# Patient Record
Sex: Male | Born: 1960 | Race: White | Hispanic: No | Marital: Married | Smoking: Former smoker
Health system: Southern US, Community
[De-identification: ages and names within clinical notes are randomized; demographics above are authoritative.]

## PROBLEM LIST (undated history)

## (undated) DIAGNOSIS — E119 Type 2 diabetes mellitus without complications: Secondary | ICD-10-CM

## (undated) DIAGNOSIS — I10 Essential (primary) hypertension: Secondary | ICD-10-CM

## (undated) DIAGNOSIS — I4891 Unspecified atrial fibrillation: Secondary | ICD-10-CM

## (undated) DIAGNOSIS — Z87891 Personal history of nicotine dependence: Secondary | ICD-10-CM

## (undated) DIAGNOSIS — E785 Hyperlipidemia, unspecified: Secondary | ICD-10-CM

## (undated) HISTORY — PX: BACK SURGERY: SHX140

---

## 2011-07-05 ENCOUNTER — Emergency Department (HOSPITAL_COMMUNITY)
Admission: EM | Admit: 2011-07-05 | Discharge: 2011-07-06 | Disposition: A | Discharge status: Home / Self Care | Payer: Worker's Compensation | Attending: Emergency Medicine | Admitting: Emergency Medicine

## 2011-07-05 ENCOUNTER — Emergency Department (HOSPITAL_COMMUNITY): Payer: Worker's Compensation

## 2011-07-05 ENCOUNTER — Encounter: Payer: Self-pay | Admitting: Emergency Medicine

## 2011-07-05 DIAGNOSIS — S161XXA Strain of muscle, fascia and tendon at neck level, initial encounter: Secondary | ICD-10-CM

## 2011-07-05 DIAGNOSIS — S20219A Contusion of unspecified front wall of thorax, initial encounter: Secondary | ICD-10-CM | POA: Insufficient documentation

## 2011-07-05 DIAGNOSIS — M25529 Pain in unspecified elbow: Secondary | ICD-10-CM | POA: Insufficient documentation

## 2011-07-05 DIAGNOSIS — Z87891 Personal history of nicotine dependence: Secondary | ICD-10-CM | POA: Insufficient documentation

## 2011-07-05 DIAGNOSIS — S139XXA Sprain of joints and ligaments of unspecified parts of neck, initial encounter: Secondary | ICD-10-CM | POA: Insufficient documentation

## 2011-07-05 DIAGNOSIS — S5002XA Contusion of left elbow, initial encounter: Secondary | ICD-10-CM

## 2011-07-05 DIAGNOSIS — M542 Cervicalgia: Secondary | ICD-10-CM | POA: Insufficient documentation

## 2011-07-05 DIAGNOSIS — S5000XA Contusion of unspecified elbow, initial encounter: Secondary | ICD-10-CM | POA: Insufficient documentation

## 2011-07-05 DIAGNOSIS — R079 Chest pain, unspecified: Secondary | ICD-10-CM | POA: Insufficient documentation

## 2011-07-05 MED ORDER — OXYCODONE-ACETAMINOPHEN 5-325 MG PO TABS
1.0000 | ORAL_TABLET | Freq: Once | ORAL | Status: AC
  Administered 2011-07-05: 1 via ORAL
  Filled 2011-07-05: qty 1

## 2011-07-05 MED ORDER — IOHEXOL 300 MG/ML  SOLN
100.0000 mL | Freq: Once | INTRAMUSCULAR | Status: AC | PRN
  Administered 2011-07-05: 100 mL via INTRAVENOUS

## 2011-07-05 MED ORDER — OXYCODONE-ACETAMINOPHEN 5-325 MG PO TABS: 1.0000 | ORAL_TABLET | Freq: Four times a day (QID) | ORAL | Status: AC | PRN
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
## 2011-07-06 NOTE — ED Notes (Signed)
Pt given discharge instructions, paperwork & prescription(s), pt verbalized understanding.   

## 2012-04-27 ENCOUNTER — Inpatient Hospital Stay (HOSPITAL_COMMUNITY): Payer: BC Managed Care – PPO

## 2012-04-27 ENCOUNTER — Encounter (HOSPITAL_COMMUNITY): Payer: Self-pay

## 2012-04-27 ENCOUNTER — Emergency Department (HOSPITAL_COMMUNITY): Payer: BC Managed Care – PPO

## 2012-04-27 ENCOUNTER — Inpatient Hospital Stay (HOSPITAL_COMMUNITY)
Admission: EM | Admit: 2012-04-27 | Discharge: 2012-05-02 | DRG: 219 | Disposition: A | Discharge status: Home Health | Payer: BC Managed Care – PPO | Attending: Orthopedic Surgery | Admitting: Orthopedic Surgery

## 2012-04-27 DIAGNOSIS — I4891 Unspecified atrial fibrillation: Secondary | ICD-10-CM | POA: Diagnosis present

## 2012-04-27 DIAGNOSIS — S82831A Other fracture of upper and lower end of right fibula, initial encounter for closed fracture: Secondary | ICD-10-CM

## 2012-04-27 DIAGNOSIS — Z87891 Personal history of nicotine dependence: Secondary | ICD-10-CM

## 2012-04-27 DIAGNOSIS — Y92410 Unspecified street and highway as the place of occurrence of the external cause: Secondary | ICD-10-CM

## 2012-04-27 DIAGNOSIS — Y998 Other external cause status: Secondary | ICD-10-CM

## 2012-04-27 DIAGNOSIS — E785 Hyperlipidemia, unspecified: Secondary | ICD-10-CM | POA: Diagnosis present

## 2012-04-27 DIAGNOSIS — S82843A Displaced bimalleolar fracture of unspecified lower leg, initial encounter for closed fracture: Secondary | ICD-10-CM | POA: Diagnosis present

## 2012-04-27 DIAGNOSIS — S82301A Unspecified fracture of lower end of right tibia, initial encounter for closed fracture: Secondary | ICD-10-CM

## 2012-04-27 DIAGNOSIS — S82209A Unspecified fracture of shaft of unspecified tibia, initial encounter for closed fracture: Principal | ICD-10-CM | POA: Diagnosis present

## 2012-04-27 DIAGNOSIS — I10 Essential (primary) hypertension: Secondary | ICD-10-CM | POA: Diagnosis present

## 2012-04-27 DIAGNOSIS — Z8249 Family history of ischemic heart disease and other diseases of the circulatory system: Secondary | ICD-10-CM

## 2012-04-27 HISTORY — DX: Essential (primary) hypertension: I10

## 2012-04-27 HISTORY — DX: Personal history of nicotine dependence: Z87.891

## 2012-04-27 HISTORY — DX: Hyperlipidemia, unspecified: E78.5

## 2012-04-27 HISTORY — DX: Unspecified atrial fibrillation: I48.91

## 2012-04-27 LAB — BASIC METABOLIC PANEL
BUN: 17 mg/dL (ref 6–23)
BUN: 12 mg/dL (ref 6–23)
CO2: 29 mEq/L (ref 19–32)
Calcium: 8.9 mg/dL (ref 8.4–10.5)
Chloride: 99 mEq/L (ref 96–112)
Creatinine, Ser: 0.96 mg/dL (ref 0.50–1.35)
GFR calc Af Amer: >90 mL/min (ref >90)
Glucose, Bld: 112 mg/dL — ABNORMAL HIGH (ref 70–99)
Sodium: 136 mEq/L (ref 135–145)

## 2012-04-27 LAB — CBC WITH DIFFERENTIAL/PLATELET
Basophils Relative: 1 % (ref 0–1)
HCT: 37.6 % — ABNORMAL LOW (ref 39.0–52.0)
Hemoglobin: 13.1 g/dL (ref 13.0–17.0)
MCH: 32.9 pg (ref 26.0–34.0)
MCHC: 34.8 g/dL (ref 30.0–36.0)
Monocytes Absolute: 0.9 10*3/uL (ref 0.1–1.0)
Monocytes Relative: 8 % (ref 3–12)
Neutro Abs: 6.9 10*3/uL (ref 1.7–7.7)

## 2012-04-27 LAB — POCT I-STAT TROPONIN I: Troponin i, poc: 0.01 ng/mL (ref 0.00–0.08)

## 2012-04-27 LAB — CBC
HCT: 36.6 % — ABNORMAL LOW (ref 39.0–52.0)
Hemoglobin: 13 g/dL (ref 13.0–17.0)
MCH: 33.6 pg (ref 26.0–34.0)
RBC: 3.87 MIL/uL — ABNORMAL LOW (ref 4.22–5.81)

## 2012-04-27 MED ORDER — SODIUM CHLORIDE 0.9 % IV SOLN
INTRAVENOUS | Status: DC
  Administered 2012-04-27 – 2012-04-30 (×5): via INTRAVENOUS

## 2012-04-27 MED ORDER — HYDROMORPHONE HCL PF 1 MG/ML IJ SOLN
1.0000 mg | INTRAMUSCULAR | Status: DC | PRN
  Administered 2012-04-27: 1 mg via INTRAVENOUS
  Filled 2012-04-27: qty 1

## 2012-04-27 MED ORDER — ONDANSETRON HCL 4 MG/2ML IJ SOLN
4.0000 mg | Freq: Three times a day (TID) | INTRAMUSCULAR | Status: AC | PRN
  Administered 2012-04-27: 4 mg via INTRAVENOUS
  Filled 2012-04-27: qty 2

## 2012-04-27 MED ORDER — HYDROMORPHONE HCL PF 1 MG/ML IJ SOLN
0.5000 mg | INTRAMUSCULAR | Status: DC | PRN
  Administered 2012-04-27 – 2012-05-01 (×18): 1 mg via INTRAVENOUS
  Filled 2012-04-27 (×19): qty 1

## 2012-04-27 MED ORDER — HYDROCODONE-ACETAMINOPHEN 5-325 MG PO TABS
1.0000 | ORAL_TABLET | ORAL | Status: DC | PRN
  Administered 2012-04-27 – 2012-05-02 (×20): 2 via ORAL
  Filled 2012-04-27 (×20): qty 2

## 2012-04-27 MED ORDER — SODIUM CHLORIDE 0.9 % IV SOLN
Freq: Once | INTRAVENOUS | Status: AC
  Administered 2012-04-27: 03:00:00 via INTRAVENOUS

## 2012-04-27 MED ORDER — METHOCARBAMOL 500 MG PO TABS
500.0000 mg | ORAL_TABLET | Freq: Three times a day (TID) | ORAL | Status: DC | PRN
  Administered 2012-04-27 – 2012-05-01 (×11): 500 mg via ORAL
  Filled 2012-04-27 (×12): qty 1

## 2012-04-27 NOTE — ED Notes (Signed)
Family at bedside. 
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
## 2012-04-27 NOTE — Progress Notes (Signed)
Subjective: Patient states pain is controlled with pain meds. Otherwise no complaints. Objective: Vital signs in last 24 hours: Temp:  [97 F (36.1 C)-98.5 F (36.9 C)] 98.5 F (36.9 C) (09/28 0600) Pulse Rate:  [72-111] 72  (09/28 0600) Resp:  [10-20] 20  (09/28 0600) BP: (111-142)/(66-92) 113/66 mmHg (09/28 0600) SpO2:  [100 %] 100 % (09/28 0600)  Intake/Output from previous day: 09/27 0701 - 09/28 0700 In: 375 [I.V.:375] Out: -  Intake/Output this shift:     Basename 04/27/12 0938 04/27/12 0053  HGB 13.0 13.1    Basename 04/27/12 0938 04/27/12 0053  WBC 9.6 10.5  RBC 3.87* 3.98*  HCT 36.6* 37.6*  PLT 248 283    Basename 04/27/12 0938 04/27/12 0053  NA 136 138  K 4.0 3.9  CL 101 99  CO2 29 27  BUN 12 17  CREATININE 0.94 0.96  GLUCOSE 112* 94  CALCIUM 8.9 9.6    Basename 04/27/12 0053  LABPT --  INR 0.99    Splint clean dry and intact Able to wiggle toes Right foot without pain Capillary refill right toes < 2 seconds Sensation intact right lesser toes to light touch Distal femur with soft compartments   Assessment/Plan: Awaiting surgery continue current care    Richardean Canal 04/27/2012, 12:33 PM
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
## 2012-04-27 NOTE — ED Notes (Signed)
MD at bedside. EDP [place] at bedside
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
## 2012-04-28 MED ORDER — ENOXAPARIN SODIUM 40 MG/0.4ML ~~LOC~~ SOLN
40.0000 mg | Freq: Once | SUBCUTANEOUS | Status: DC
  Filled 2012-04-28: qty 0.4

## 2012-04-28 NOTE — Progress Notes (Signed)
Subjective: Patient doing well. No complaints just wants to eat.  Objective: Vital signs in last 24 hours: Temp:  [98 F (36.7 C)-98.1 F (36.7 C)] 98.1 F (36.7 C) (09/29 0521) Pulse Rate:  [83-93] 93  (09/29 0521) Resp:  [18-20] 18  (09/29 0521) BP: (119-141)/(76-89) 128/83 mmHg (09/29 0521) SpO2:  [98 %-100 %] 99 % (09/29 0521) Weight:  [104.327 kg (230 lb)] 104.327 kg (230 lb) (09/28 2127)  Intake/Output from previous day: 09/28 0701 - 09/29 0700 In: 680 [P.O.:680] Out: 2100 [Urine:2100] Intake/Output this shift:     Basename 04/27/12 0938 04/27/12 0053  HGB 13.0 13.1    Basename 04/27/12 0938 04/27/12 0053  WBC 9.6 10.5  RBC 3.87* 3.98*  HCT 36.6* 37.6*  PLT 248 283    Basename 04/27/12 0938 04/27/12 0053  NA 136 138  K 4.0 3.9  CL 101 99  CO2 29 27  BUN 12 17  CREATININE 0.94 0.96  GLUCOSE 112* 94  CALCIUM 8.9 9.6    Basename 04/27/12 0053  LABPT --  INR 0.99    Neurovascular intact splint C/D/I fits well Toe edema improved Assessment/Plan: Closed right comminuted tibia fracture extending to the joint with posterior-medial joint   NPO after mid night tonight Surgery pending Continue current care      Richardean Canal 04/28/2012, 12:13 PM

## 2012-04-29 ENCOUNTER — Inpatient Hospital Stay (HOSPITAL_COMMUNITY): Payer: BC Managed Care – PPO

## 2012-04-29 ENCOUNTER — Encounter (HOSPITAL_COMMUNITY): Payer: Self-pay | Admitting: Certified Registered"

## 2012-04-29 ENCOUNTER — Inpatient Hospital Stay (HOSPITAL_COMMUNITY): Payer: BC Managed Care – PPO | Admitting: Certified Registered"

## 2012-04-29 ENCOUNTER — Encounter (HOSPITAL_COMMUNITY)
Admission: EM | Disposition: A | Discharge status: Home Health | Payer: Self-pay | Source: Home / Self Care | Attending: Orthopedic Surgery

## 2012-04-29 HISTORY — PX: TIBIA IM NAIL INSERTION: SHX2516

## 2012-04-29 HISTORY — PX: ORIF ANKLE FRACTURE: SHX5408

## 2012-04-29 LAB — SURGICAL PCR SCREEN: Staphylococcus aureus: POSITIVE — AB

## 2012-04-29 SURGERY — INSERTION, INTRAMEDULLARY ROD, TIBIA
Anesthesia: General | Site: Leg Lower | Laterality: Right | Wound class: Clean

## 2012-04-29 MED ORDER — PROPOFOL 10 MG/ML IV BOLUS
INTRAVENOUS | Status: DC | PRN
  Administered 2012-04-29: 200 mg via INTRAVENOUS

## 2012-04-29 MED ORDER — HYDROMORPHONE HCL PF 1 MG/ML IJ SOLN
0.2500 mg | INTRAMUSCULAR | Status: DC | PRN
  Administered 2012-04-29 (×2): 0.5 mg via INTRAVENOUS

## 2012-04-29 MED ORDER — MIDAZOLAM HCL 5 MG/5ML IJ SOLN
INTRAMUSCULAR | Status: DC | PRN
  Administered 2012-04-29: 2 mg via INTRAVENOUS

## 2012-04-29 MED ORDER — HYDROMORPHONE HCL PF 1 MG/ML IJ SOLN
INTRAMUSCULAR | Status: DC | PRN
  Administered 2012-04-29 (×4): .25 mg via INTRAVENOUS

## 2012-04-29 MED ORDER — NEOSTIGMINE METHYLSULFATE 1 MG/ML IJ SOLN
INTRAMUSCULAR | Status: DC | PRN
  Administered 2012-04-29: 3 mg via INTRAVENOUS

## 2012-04-29 MED ORDER — ROCURONIUM BROMIDE 100 MG/10ML IV SOLN
INTRAVENOUS | Status: DC | PRN
  Administered 2012-04-29: 10 mg via INTRAVENOUS
  Administered 2012-04-29: 50 mg via INTRAVENOUS
  Administered 2012-04-29: 20 mg via INTRAVENOUS
  Administered 2012-04-29 (×2): 10 mg via INTRAVENOUS

## 2012-04-29 MED ORDER — CEFAZOLIN SODIUM-DEXTROSE 2-3 GM-% IV SOLR
INTRAVENOUS | Status: AC
  Filled 2012-04-29: qty 50

## 2012-04-29 MED ORDER — ACETAMINOPHEN 10 MG/ML IV SOLN
INTRAVENOUS | Status: AC
  Filled 2012-04-29: qty 100

## 2012-04-29 MED ORDER — GLYCOPYRROLATE 0.2 MG/ML IJ SOLN
INTRAMUSCULAR | Status: DC | PRN
  Administered 2012-04-29: 0.4 mg via INTRAVENOUS

## 2012-04-29 MED ORDER — ACETAMINOPHEN 10 MG/ML IV SOLN
INTRAVENOUS | Status: DC | PRN
  Administered 2012-04-29: 1000 mg via INTRAVENOUS

## 2012-04-29 MED ORDER — OXYCODONE HCL 5 MG/5ML PO SOLN: 5.0000 mg | Freq: Once | ORAL | Status: AC | PRN

## 2012-04-29 MED ORDER — CEFAZOLIN SODIUM 1-5 GM-% IV SOLN
INTRAVENOUS | Status: DC | PRN
  Administered 2012-04-29: 2 g via INTRAVENOUS

## 2012-04-29 MED ORDER — OXYCODONE HCL 5 MG PO TABS
ORAL_TABLET | ORAL | Status: AC
  Filled 2012-04-29: qty 1

## 2012-04-29 MED ORDER — LIDOCAINE HCL (CARDIAC) 20 MG/ML IV SOLN
INTRAVENOUS | Status: DC | PRN
  Administered 2012-04-29: 100 mg via INTRAVENOUS

## 2012-04-29 MED ORDER — OXYCODONE HCL 5 MG PO TABS
5.0000 mg | ORAL_TABLET | Freq: Once | ORAL | Status: AC | PRN
  Administered 2012-04-29: 5 mg via ORAL

## 2012-04-29 MED ORDER — LACTATED RINGERS IV SOLN
INTRAVENOUS | Status: DC | PRN
  Administered 2012-04-29 (×4): via INTRAVENOUS

## 2012-04-29 MED ORDER — PROPRANOLOL HCL 1 MG/ML IV SOLN
INTRAVENOUS | Status: DC | PRN
  Administered 2012-04-29 (×3): .25 mg via INTRAVENOUS

## 2012-04-29 MED ORDER — PROMETHAZINE HCL 25 MG/ML IJ SOLN
6.2500 mg | INTRAMUSCULAR | Status: DC | PRN
  Filled 2012-04-29: qty 1

## 2012-04-29 MED ORDER — ONDANSETRON HCL 4 MG/2ML IJ SOLN
INTRAMUSCULAR | Status: DC | PRN
  Administered 2012-04-29: 4 mg via INTRAVENOUS

## 2012-04-29 MED ORDER — FENTANYL CITRATE 0.05 MG/ML IJ SOLN
INTRAMUSCULAR | Status: DC | PRN
  Administered 2012-04-29 (×4): 50 ug via INTRAVENOUS
  Administered 2012-04-29: 100 ug via INTRAVENOUS
  Administered 2012-04-29 (×4): 50 ug via INTRAVENOUS

## 2012-04-29 MED ORDER — 0.9 % SODIUM CHLORIDE (POUR BTL) OPTIME
TOPICAL | Status: DC | PRN
  Administered 2012-04-29: 1000 mL

## 2012-04-29 MED ORDER — VECURONIUM BROMIDE 10 MG IV SOLR
INTRAVENOUS | Status: DC | PRN
  Administered 2012-04-29 (×2): 2 mg via INTRAVENOUS

## 2012-04-29 MED ORDER — HYDROMORPHONE HCL PF 1 MG/ML IJ SOLN
INTRAMUSCULAR | Status: AC
  Filled 2012-04-29: qty 1

## 2012-04-29 MED ORDER — MEPERIDINE HCL 25 MG/ML IJ SOLN: 6.2500 mg | INTRAMUSCULAR | Status: DC | PRN

## 2012-04-29 SURGICAL SUPPLY — 87 items
BANDAGE ELASTIC 4 VELCRO ST LF (GAUZE/BANDAGES/DRESSINGS) ×2 IMPLANT
BANDAGE ELASTIC 6 VELCRO ST LF (GAUZE/BANDAGES/DRESSINGS) ×2 IMPLANT
BANDAGE ESMARK 6X9 LF (GAUZE/BANDAGES/DRESSINGS) ×1 IMPLANT
BANDAGE GAUZE ELAST BULKY 4 IN (GAUZE/BANDAGES/DRESSINGS) ×4 IMPLANT
BIT DRILL 2.5X2.75 QC CALB (BIT) ×2 IMPLANT
BIT DRILL 3.5X5.5 QC CALB (BIT) ×2 IMPLANT
BIT DRILL 4.4 (MISCELLANEOUS) ×2 IMPLANT
BIT DRILL 6X3.8 (MISCELLANEOUS) ×2 IMPLANT
BLADE SURG 10 STRL SS (BLADE) ×4 IMPLANT
BNDG COHESIVE 4X5 TAN STRL (GAUZE/BANDAGES/DRESSINGS) ×2 IMPLANT
BNDG ESMARK 6X9 LF (GAUZE/BANDAGES/DRESSINGS) ×2
BRUSH SCRUB DISP (MISCELLANEOUS) ×4 IMPLANT
CLOTH BEACON ORANGE TIMEOUT ST (SAFETY) ×2 IMPLANT
COVER SURGICAL LIGHT HANDLE (MISCELLANEOUS) ×4 IMPLANT
DRAPE C-ARM 42X72 X-RAY (DRAPES) ×2 IMPLANT
DRAPE C-ARMOR (DRAPES) ×2 IMPLANT
DRAPE INCISE IOBAN 66X45 STRL (DRAPES) ×2 IMPLANT
DRAPE ORTHO SPLIT 77X108 STRL (DRAPES) ×2
DRAPE PROXIMA HALF (DRAPES) ×4 IMPLANT
DRAPE SURG ORHT 6 SPLT 77X108 (DRAPES) ×2 IMPLANT
DRAPE U-SHAPE 47X51 STRL (DRAPES) ×2 IMPLANT
DRSG ADAPTIC 3X8 NADH LF (GAUZE/BANDAGES/DRESSINGS) ×2 IMPLANT
DRSG EMULSION OIL 3X3 NADH (GAUZE/BANDAGES/DRESSINGS) IMPLANT
DRSG PAD ABDOMINAL 8X10 ST (GAUZE/BANDAGES/DRESSINGS) ×2 IMPLANT
ELECT REM PT RETURN 9FT ADLT (ELECTROSURGICAL) ×2
ELECTRODE REM PT RTRN 9FT ADLT (ELECTROSURGICAL) ×1 IMPLANT
EVACUATOR 1/8 PVC DRAIN (DRAIN) IMPLANT
GLOVE BIO SURGEON ST LM GN SZ9 (GLOVE) ×2 IMPLANT
GLOVE BIO SURGEON STRL SZ7.5 (GLOVE) IMPLANT
GLOVE BIO SURGEON STRL SZ8 (GLOVE) ×2 IMPLANT
GLOVE BIO SURGEON STRL SZ8.5 (GLOVE) ×2 IMPLANT
GLOVE BIOGEL PI IND STRL 6.5 (GLOVE) ×1 IMPLANT
GLOVE BIOGEL PI IND STRL 7.5 (GLOVE) IMPLANT
GLOVE BIOGEL PI IND STRL 8 (GLOVE) ×1 IMPLANT
GLOVE BIOGEL PI INDICATOR 6.5 (GLOVE) ×1
GLOVE BIOGEL PI INDICATOR 7.5 (GLOVE)
GLOVE BIOGEL PI INDICATOR 8 (GLOVE) ×1
GLOVE SURG SS PI 6.0 STRL IVOR (GLOVE) ×2 IMPLANT
GOWN BRE IMP SLV SIRUS LXLNG (GOWN DISPOSABLE) ×2 IMPLANT
GOWN PREVENTION PLUS XLARGE (GOWN DISPOSABLE) ×2 IMPLANT
GOWN STRL NON-REIN LRG LVL3 (GOWN DISPOSABLE) ×4 IMPLANT
GOWN STRL REIN 2XL XLG LVL4 (GOWN DISPOSABLE) ×2 IMPLANT
GUIDEWIRE BALL NOSE 80CM (WIRE) ×2 IMPLANT
KIT BASIN OR (CUSTOM PROCEDURE TRAY) ×2 IMPLANT
KIT ROOM TURNOVER OR (KITS) ×2 IMPLANT
MANIFOLD NEPTUNE II (INSTRUMENTS) ×2 IMPLANT
NAIL TIBIAL 10X39M (Nail) ×2 IMPLANT
NEEDLE HYPO 21X1.5 SAFETY (NEEDLE) IMPLANT
NS IRRIG 1000ML POUR BTL (IV SOLUTION) ×2 IMPLANT
PACK GENERAL/GYN (CUSTOM PROCEDURE TRAY) ×2 IMPLANT
PAD ARMBOARD 7.5X6 YLW CONV (MISCELLANEOUS) ×4 IMPLANT
PAD CAST 4YDX4 CTTN HI CHSV (CAST SUPPLIES) IMPLANT
PADDING CAST COTTON 4X4 STRL (CAST SUPPLIES)
PADDING CAST COTTON 6X4 STRL (CAST SUPPLIES) IMPLANT
PENCIL BUTTON HOLSTER BLD 10FT (ELECTRODE) ×2 IMPLANT
SCREW ACECAP 44MM (Screw) ×2 IMPLANT
SCREW ACECAP 50MM (Screw) ×2 IMPLANT
SCREW CORTICAL 3.5MM  42MM (Screw) ×1 IMPLANT
SCREW CORTICAL 3.5MM 42MM (Screw) ×1 IMPLANT
SCREW CORTICAL 3.5MM 50MM (Screw) ×2 IMPLANT
SCREW PROXIMAL 4.5MMX18MM (Screw) ×2 IMPLANT
SCREW PROXIMAL DEPUY (Screw) ×2 IMPLANT
SCREW PRXML FT 60X5.5XNS LF (Screw) ×1 IMPLANT
SCREW PRXML FT 65X5.5XNS CORT (Screw) ×1 IMPLANT
SPONGE GAUZE 4X4 12PLY (GAUZE/BANDAGES/DRESSINGS) ×2 IMPLANT
SPONGE LAP 18X18 X RAY DECT (DISPOSABLE) IMPLANT
SPONGE SCRUB IODOPHOR (GAUZE/BANDAGES/DRESSINGS) ×2 IMPLANT
STAPLER VISISTAT 35W (STAPLE) ×2 IMPLANT
STRIP CLOSURE SKIN 1/2X4 (GAUZE/BANDAGES/DRESSINGS) ×2 IMPLANT
SUCTION FRAZIER TIP 10 FR DISP (SUCTIONS) ×2 IMPLANT
SUT ETHILON 2 0 FS 18 (SUTURE) IMPLANT
SUT ETHILON 3 0 PS 1 (SUTURE) ×4 IMPLANT
SUT PDS AB 2-0 CT1 27 (SUTURE) IMPLANT
SUT PROLENE 3 0 PS 2 (SUTURE) IMPLANT
SUT VIC AB 0 CT1 27 (SUTURE) ×1
SUT VIC AB 0 CT1 27XBRD ANBCTR (SUTURE) ×1 IMPLANT
SUT VIC AB 2-0 CT1 27 (SUTURE) ×1
SUT VIC AB 2-0 CT1 TAPERPNT 27 (SUTURE) ×1 IMPLANT
SUT VIC AB 2-0 CT3 27 (SUTURE) IMPLANT
SYR CONTROL 10ML LL (SYRINGE) IMPLANT
TOWEL OR 17X24 6PK STRL BLUE (TOWEL DISPOSABLE) ×2 IMPLANT
TOWEL OR 17X26 10 PK STRL BLUE (TOWEL DISPOSABLE) ×4 IMPLANT
TRAY FOLEY CATH 14FR (SET/KITS/TRAYS/PACK) ×2 IMPLANT
TUBE CONNECTING 12X1/4 (SUCTIONS) ×2 IMPLANT
UNDERPAD 30X30 INCONTINENT (UNDERPADS AND DIAPERS) ×2 IMPLANT
WATER STERILE IRR 1000ML POUR (IV SOLUTION) IMPLANT
YANKAUER SUCT BULB TIP NO VENT (SUCTIONS) ×2 IMPLANT
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
## 2012-04-29 NOTE — Anesthesia Postprocedure Evaluation (Signed)
Anesthesia Post Note  Patient: [name] Love  Procedure(s) Performed: Procedure(s) (LRB): INTRAMEDULLARY (IM) NAIL TIBIAL (Right) OPEN REDUCTION INTERNAL FIXATION (ORIF) ANKLE FRACTURE (Right)  Anesthesia type: general  Patient location: PACU  Post pain: Pain level controlled  Post assessment: Patient's Cardiovascular Status Stable  Last Vitals:  Filed Vitals:   04/29/12 1547  BP: 133/84  Pulse: 80  Temp: 37.1 C  Resp: 14    Post vital signs: Reviewed and stable  Level of consciousness: sedated  Complications: No apparent anesthesia complications

## 2012-04-29 NOTE — Preoperative (Signed)
Beta Blockers   Reason not to administer Beta Blockers:Not Applicable 

## 2012-04-29 NOTE — Brief Op Note (Signed)
04/27/2012 - 04/29/2012  4:08 PM  PATIENT:  [name] Love  51 y.o. male  PRE-OPERATIVE DIAGNOSIS:  Tibial shaft fracture, bimalleolar plafond fracture  POST-OPERATIVE DIAGNOSIS:  Tibial shaft fracture,  bimalleolar plafond fracture  PROCEDURE:  IMN right tibia Biomet Versanail 10x390 static  ORIF bimalleolar plafond frx  SURGEON:  Surgeon(s) and Role:    * Budd Palmer, MD - Primary  ANESTHESIA:   general  EBL:  Total I/O In: 2200 [I.V.:2200] Out: 1120 [Urine:1100; Blood:20]  BLOOD ADMINISTERED:none  DRAINS: none   LOCAL MEDICATIONS USED:  NONE  SPECIMEN:  No Specimen  DISPOSITION OF SPECIMEN:  N/A  COUNTS:  YES  TOURNIQUET:  * No tourniquets in log *  DICTATION: .Other Dictation: Dictation Number [id number]  PLAN OF CARE: Admit to inpatient   PATIENT DISPOSITION:  PACU - hemodynamically stable.   Delay start of Pharmacological VTE agent (>24hrs) due to surgical blood loss or risk of bleeding: no

## 2012-04-29 NOTE — Anesthesia Preprocedure Evaluation (Signed)
Anesthesia Evaluation  Patient identified by MRN, date of birth, ID band Patient awake    Reviewed: Allergy & Precautions, H&P , NPO status , Patient's Chart, lab work & pertinent test results  History of Anesthesia Complications Negative for: history of anesthetic complications  Airway Mallampati: II  Neck ROM: Full    Dental  (+) Teeth Intact   Pulmonary neg pulmonary ROS,  breath sounds clear to auscultation        Cardiovascular hypertension, + dysrhythmias Atrial Fibrillation Rhythm:Irregular Rate:Normal  Rx c ASA only   Neuro/Psych    GI/Hepatic negative GI ROS, Neg liver ROS,   Endo/Other  negative endocrine ROS  Renal/GU negative Renal ROS     Musculoskeletal   Abdominal (+) + obese,   Peds  Hematology negative hematology ROS (+)   Anesthesia Other Findings   Reproductive/Obstetrics                           Anesthesia Physical Anesthesia Plan  ASA: II  Anesthesia Plan: General   Post-op Pain Management:    Induction: Intravenous  Airway Management Planned: Oral ETT  Additional Equipment:   Intra-op Plan:   Post-operative Plan: Extubation in OR  Informed Consent: I have reviewed the patients History and Physical, chart, labs and discussed the procedure including the risks, benefits and alternatives for the proposed anesthesia with the patient or authorized representative who has indicated his/her understanding and acceptance.   Dental advisory given  Plan Discussed with:   Anesthesia Plan Comments:         Anesthesia Quick Evaluation

## 2012-04-29 NOTE — Transfer of Care (Signed)
Immediate Anesthesia Transfer of Care Note  Patient: [name] Love  Procedure(s) Performed: Procedure(s) (LRB) with comments: INTRAMEDULLARY (IM) NAIL TIBIAL (Right) - Dupuy nail, Biomet versanail, Synthes small fragment, Depuy small fragment OPEN REDUCTION INTERNAL FIXATION (ORIF) ANKLE FRACTURE (Right)  Patient Location: PACU  Anesthesia Type: General  Level of Consciousness: awake, alert , oriented and patient cooperative  Airway & Oxygen Therapy: Patient Spontanous Breathing and Patient connected to nasal cannula oxygen  Post-op Assessment: Report given to PACU RN and Post -op Vital signs reviewed and stable  Post vital signs: Reviewed and stable  Complications: No apparent anesthesia complications

## 2012-04-30 ENCOUNTER — Encounter (HOSPITAL_COMMUNITY): Payer: Self-pay | Admitting: Orthopedic Surgery

## 2012-04-30 ENCOUNTER — Inpatient Hospital Stay (HOSPITAL_COMMUNITY): Payer: BC Managed Care – PPO

## 2012-04-30 MED ORDER — ENOXAPARIN SODIUM 40 MG/0.4ML ~~LOC~~ SOLN: 40.0000 mg | Freq: Once | SUBCUTANEOUS | Status: DC

## 2012-04-30 MED ORDER — HYDROCODONE-ACETAMINOPHEN 5-325 MG PO TABS: 1.0000 | ORAL_TABLET | ORAL | Status: DC | PRN
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
## 2012-04-30 NOTE — Progress Notes (Signed)
Physical Therapy Evaluation Patient Details Name: [name] Love MRN: [id number] DOB: July 24, 1961 Today's Date: 04/30/2012 Time: 9528-4132 PT Time Calculation (min): 25 min  PT Assessment / Plan / Recommendation Clinical Impression  Pt is a 51 y.o. M s/p ORIF R LE.  Pt will benefit from acute physical therapy to increase safety, independence, strength, and endurance.    PT Assessment  Patient needs continued PT services    Follow Up Recommendations  Home health PT    Barriers to Discharge None      Equipment Recommendations  Rolling walker with 5" wheels;3 in 1 bedside comode    Recommendations for Other Services OT consult   Frequency Min 5X/week    Precautions / Restrictions Precautions Precautions: Fall Restrictions Weight Bearing Restrictions: Yes RLE Weight Bearing: Non weight bearing   Pertinent Vitals/Pain Pain is 5/10 in R LE.      Mobility  Bed Mobility Bed Mobility: Supine to Sit;Sitting - Scoot to Edge of Bed Supine to Sit: 4: Min assist;HOB flat Sitting - Scoot to [place] of Bed: 4: Min assist Details for Bed Mobility Assistance: (A) to support R LE and lower the LE OOB.  Cues for proper technique and safety Transfers Transfers: Sit to Stand;Stand to Sit;Stand Pivot Transfers Sit to Stand: 4: Min assist;With upper extremity assist;From bed Stand to Sit: 4: Min assist;To chair/3-in-1;With upper extremity assist Stand Pivot Transfers: 4: Min assist Details for Transfer Assistance: (A) for RW placement, initiating mvt, and slowing the descent.  Cues for proper technique, hand placement, and safety Ambulation/Gait Ambulation/Gait Assistance: Not tested (comment)    Shoulder Instructions     Exercises General Exercises - Lower Extremity Ankle Circles/Pumps: AROM;Left;5 reps   PT Diagnosis: Difficulty walking;Abnormality of gait;Generalized weakness;Acute pain  PT Problem List: Decreased strength;Decreased range of motion;Decreased activity  tolerance;Decreased balance;Decreased mobility;Pain;Decreased knowledge of use of DME PT Treatment Interventions: DME instruction;Gait training;Stair training;Functional mobility training;Therapeutic activities;Therapeutic exercise;Balance training;Neuromuscular re-education;Patient/family education   PT Goals Acute Rehab PT Goals PT Goal Formulation: With patient Time For Goal Achievement: 05/07/12 Potential to Achieve Goals: Good Pt will go Supine/Side to Sit: with modified independence;with HOB 0 degrees PT Goal: Supine/Side to Sit - Progress: Goal set today Pt will go Sit to Supine/Side: with modified independence;with HOB 0 degrees PT Goal: Sit to Supine/Side - Progress: Goal set today Pt will go Sit to Stand: with modified independence;with upper extremity assist PT Goal: Sit to Stand - Progress: Goal set today Pt will go Stand to Sit: with modified independence;with upper extremity assist PT Goal: Stand to Sit - Progress: Goal set today Pt will Transfer Bed to Chair/Chair to Bed: with modified independence PT Transfer Goal: Bed to Chair/Chair to Bed - Progress: Goal set today Pt will Ambulate: 51 - 150 feet;with supervision;with least restrictive assistive device PT Goal: Ambulate - Progress: Goal set today Pt will Go Up / Down Stairs: 3-5 stairs;with least restrictive assistive device PT Goal: Up/Down Stairs - Progress: Goal set today Pt will Perform Home Exercise Program: Independently PT Goal: Perform Home Exercise Program - Progress: Goal set today  Visit Information  Last PT Received On: 04/30/12 Assistance Needed: +1    Subjective Data  Subjective: My knee cap feels really sore. Patient Stated Goal: To go home   Prior Functioning  Home Living Lives With: Spouse Available Help at Discharge: Family;Neighbor Type of Home: Mobile home Home Access: Stairs to enter [employer] of Steps: 4 Entrance Stairs-Rails: Can reach both Home Layout: One level Bathroom  Shower/Tub:  Tub/shower unit;Curtain Teacher, early years/pre: Yes How Accessible: Accessible via walker (walk sideways) Home Adaptive Equipment: None Prior Function Level of Independence: Independent Able to Take Stairs?: Yes Driving: Yes Vocation: Other (comment) Youth worker) Communication Communication: No difficulties Dominant Hand: Right    Cognition  Overall Cognitive Status: Appears within functional limits for tasks assessed/performed Arousal/Alertness: Awake/alert Orientation Level: Appears intact for tasks assessed Behavior During Session: [facility] for tasks performed    Extremity/Trunk Assessment     Balance    End of Session PT - End of Session Equipment Utilized During Treatment: Gait belt Activity Tolerance: Patient tolerated treatment well Patient left: in chair;with call bell/phone within reach Nurse Communication: Mobility status  GP     DITOMMASO, AMY 04/30/2012, 3:58 PM [place], PT DPT [phone]

## 2012-04-30 NOTE — Progress Notes (Signed)
Patient is post op and does not have any PT orders, MD notified, no other complaints at this time.

## 2012-05-01 ENCOUNTER — Encounter (HOSPITAL_COMMUNITY): Payer: Self-pay | Admitting: Cardiology

## 2012-05-01 DIAGNOSIS — I4891 Unspecified atrial fibrillation: Secondary | ICD-10-CM | POA: Diagnosis present

## 2012-05-01 DIAGNOSIS — S82899A Other fracture of unspecified lower leg, initial encounter for closed fracture: Secondary | ICD-10-CM

## 2012-05-01 LAB — BASIC METABOLIC PANEL
Calcium: 9.3 mg/dL (ref 8.4–10.5)
Chloride: 97 mEq/L (ref 96–112)
Creatinine, Ser: 0.94 mg/dL (ref 0.50–1.35)
GFR calc Af Amer: >90 mL/min (ref >90)
Sodium: 136 mEq/L (ref 135–145)

## 2012-05-01 LAB — CBC
HCT: 34.6 % — ABNORMAL LOW (ref 39.0–52.0)
MCHC: 34.1 g/dL (ref 30.0–36.0)
MCV: 95.3 fL (ref 78.0–100.0)
Platelets: 290 10*3/uL (ref 150–400)
RDW: 12.6 % (ref 11.5–15.5)

## 2012-05-01 LAB — CK TOTAL AND CKMB (NOT AT ARMC)
CK, MB: 2.1 ng/mL (ref 0.3–4.0)
Total CK: 270 U/L — ABNORMAL HIGH (ref 7–232)

## 2012-05-01 MED ORDER — ASPIRIN EC 325 MG PO TBEC
325.0000 mg | DELAYED_RELEASE_TABLET | Freq: Every day | ORAL | Status: DC
  Administered 2012-05-01: 325 mg via ORAL
  Filled 2012-05-01: qty 1

## 2012-05-01 MED ORDER — METOPROLOL TARTRATE 12.5 MG HALF TABLET
12.5000 mg | ORAL_TABLET | Freq: Two times a day (BID) | ORAL | Status: DC
  Administered 2012-05-01 – 2012-05-02 (×2): 12.5 mg via ORAL
  Filled 2012-05-01 (×4): qty 1

## 2012-05-01 MED ORDER — ASPIRIN EC 81 MG PO TBEC
81.0000 mg | DELAYED_RELEASE_TABLET | Freq: Every day | ORAL | Status: DC
  Administered 2012-05-02: 81 mg via ORAL
  Filled 2012-05-01: qty 1

## 2012-05-01 MED ORDER — ENOXAPARIN SODIUM 40 MG/0.4ML ~~LOC~~ SOLN
40.0000 mg | SUBCUTANEOUS | Status: DC
  Administered 2012-05-01 – 2012-05-02 (×2): 40 mg via SUBCUTANEOUS
  Filled 2012-05-01 (×2): qty 0.4

## 2012-05-01 NOTE — Progress Notes (Signed)
Patient felt like he was going to "pass out" when he was working with PT/OT.  Per staff his pulse 30-140, Patient gray with dusky lips, diaphoretic, and SOB.  12 lead EKG done post patient recovered and resting comfortably in chair.   BP 134/78  HR 102 (AF)  RR 20  O2 sat 100% Per patient, Hx of AF and unsuccessful cardioversion,  Home meds include ASA and HCTZ.  MD notified of event, orders received and plans to consult cardiology. RN to call if assistance needed.

## 2012-05-01 NOTE — Progress Notes (Signed)
Physical Therapy Progress Note   05/01/12 1300  PT Visit Information  Last PT Received On 05/01/12  Assistance Needed +1  PT/OT Co-Evaluation/Treatment Yes  PT Time Calculation  PT Start Time 0845  PT Stop Time 0945  PT Time Calculation (min) 60 min  Subjective Data  Subjective I am in a lot of pain today  Patient Stated Goal To go home today  Precautions  Precautions Fall  Restrictions  Weight Bearing Restrictions Yes  RLE Weight Bearing NWB  Cognition  Overall Cognitive Status Appears within functional limits for tasks assessed/performed  Arousal/Alertness Awake/alert  Orientation Level Appears intact for tasks assessed  Behavior During Session [facility] for tasks performed  Bed Mobility  Bed Mobility Supine to Sit;Sitting - Scoot to Edge of Bed  Supine to Sit 4: Min guard;HOB flat  Sitting - Scoot to [place] of Bed 4: Min guard  Details for Bed Mobility Assistance Min guard for safety and cues for proper technique  Transfers  Transfers Sit to Stand;Stand to Sit;Stand Pivot Transfers  Sit to Stand 3: Mod assist;With upper extremity assist;From bed;From chair/3-in-1  Stand to Sit 3: Mod assist;With upper extremity assist;To bed;To chair/3-in-1  Stand Pivot Transfers 3: Mod assist  Details for Transfer Assistance Pt limited by pain.  Felt very fatigued during transfers and experienced dizziness, nausea, sweaty, and pale.  Pt required max cues for hand placement and safety  Ambulation/Gait  Ambulation/Gait Assistance Not tested (comment)  Stairs Yes  Stairs Assistance Not tested (comment) (Demonstrated and given handout)  Stair Management Technique No rails;Wheelchair;Backwards  Number of Stairs 2   Wheelchair Mobility  Wheelchair Mobility No  PT - End of Session  Equipment Utilized During Treatment Gait belt  Activity Tolerance Patient limited by fatigue;Patient limited by pain;Treatment limited secondary to medical complications (Comment)  Patient left in chair;with call  bell/phone within reach;with family/visitor present  Nurse Communication Mobility status;Other (comment) (Cardiovascular pulse and BP)  PT - Assessment/Plan  Comments on Treatment Session Pt experienced light-headedness and became dizzy.  Pt reported feeling as though he was going to pass out after performing a stand pivot transfer from tub to w/c.  Pt BP recorded as 143/88 and pulse high 130's.  Pt report taking BP meds from his room and nurse was informed.  Pt not safe for d/c.  PT Plan Discharge plan remains appropriate;Frequency remains appropriate  PT Frequency Min 5X/week  Recommendations for Other Services Other (comment) (None)  Follow Up Recommendations Home health PT  Equipment Recommended Rolling walker with 5" wheels;3 in 1 bedside comode;Wheelchair (measurements) (elevated leg )  Acute Rehab PT Goals  PT Goal Formulation With patient  Time For Goal Achievement 05/07/12  Potential to Achieve Goals Good  Pt will go Supine/Side to Sit with modified independence;with HOB 0 degrees  PT Goal: Supine/Side to Sit - Progress Progressing toward goal  Pt will go Sit to Stand with modified independence;with upper extremity assist  PT Goal: Sit to Stand - Progress Progressing toward goal  Pt will go Stand to Sit with modified independence;with upper extremity assist  PT Goal: Stand to Sit - Progress Not met  Pt will Transfer Bed to Chair/Chair to Bed with modified independence  PT Transfer Goal: Bed to Chair/Chair to Bed - Progress Not met  Pt will Go Up / Down Stairs 3-5 stairs;with least restrictive assistive device  PT Goal: Up/Down Stairs - Progress Not met    15/10 pain in R LE  [name] Love, SPT
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
## 2012-05-01 NOTE — Evaluation (Signed)
Occupational Therapy Evaluation Patient Details Name: [name] Love MRN: [id number] DOB: 22-Nov-1960 Today's Date: 05/01/2012 Time: 7829-5621 OT Time Calculation (min): 47 min  OT Assessment / Plan / Recommendation Clinical Impression  51 yo male s/p ORIF R LE that coudl benefit from acute OT and HHOT at d/c. Pt is not adequate level for d/c home yet.     OT Assessment  Patient needs continued OT Services    Follow Up Recommendations  Home health OT    Barriers to Discharge Decreased caregiver support wife with recent ACDF  Equipment Recommendations  Rolling walker with 5" wheels;3 in 1 bedside comode;Wheelchair (measurements);Wheelchair cushion (measurements) (RECOMMEND W/C)    Recommendations for Other Services    Frequency  Min 2X/week    Precautions / Restrictions Precautions Precautions: Fall Restrictions Weight Bearing Restrictions: Yes RLE Weight Bearing: Non weight bearing   Pertinent Vitals/Pain  SEE BELOW   ADL  Grooming: Performed;Wash/dry hands;Wash/dry face;Modified independent Where Assessed - Grooming: Supported sitting Upper Body Bathing: Chest;Right arm;Left arm;Abdomen;Simulated;Modified independent Where Assessed - Upper Body Bathing: Supported sitting Toilet Transfer: Simulated;Minimal Dentist Method: Sit to Barista: Raised toilet seat with arms (or 3-in-1 over toilet) Tub/Shower Transfer: Performed;Moderate assistance Tub/Shower Transfer Method: Stand pivot Tub/Shower Transfer Equipment: Shower seat with back;Grab bars Equipment Used: Gait belt;Rolling walker Transfers/Ambulation Related to ADLs: Pt currently only able to pivot with RW. Pt demonstrated difficulty with hopping using RW for transfers ADL Comments: pt with d/c plans for today and educated on tub transfer using shower seat and w/c due to width of bathroom. Pt very diaporethic on arrival to ortho gym with PT with dyspnea. Pt required restbreak in  chair. Pt sit<>stand to w/c with rest break. Pt stand pivot to tub with incr diaporethic required 10 minutes rest break with cool liquid, cool rag to face and OT fanning patient. Pt then transfered with w/c on Lt side from tub to w/c. Pt again with diaporethic and pale color to face reporting feeling light headed and near passing out. Pt with vitals obtained 143/88 O2 98-100% RA    HR  ranged 120-140s  .Pt reports not taking BP medication this AM from home. Pt informs RN Lauren present that BP medication was taken 04/30/12 but not 05/01/12. Pt repositioned from w/c to recliner with BIL LE eleavated and chair reclined at end of session. Pt reports HA as this time.    OT Diagnosis: Generalized weakness;Acute pain  OT Problem List: Decreased strength;Decreased activity tolerance;Impaired balance (sitting and/or standing);Decreased safety awareness;Decreased knowledge of use of DME or AE;Decreased knowledge of precautions;Pain OT Treatment Interventions: Self-care/ADL training;Therapeutic exercise;DME and/or AE instruction;Therapeutic activities;Patient/family education;Balance training   OT Goals Acute Rehab OT Goals OT Goal Formulation: With patient/family Time For Goal Achievement: 05/15/12 Potential to Achieve Goals: Good  Visit Information  Last OT Received On: 05/01/12 Assistance Needed: +1 PT/OT Co-Evaluation/Treatment: Yes    Subjective Data  Subjective: "I really want to go home today" - pt anxious to leave Patient Stated Goal: pt really wants to go home today but realizes that an additional acute night at [facility] would be a good idea   Prior Functioning     Home Living Lives With: Spouse Available Help at Discharge: Family;Neighbor Type of Home: Mobile home Home Access: Stairs to enter [employer] of Steps: 4 Entrance Stairs-Rails: Can reach both Home Layout: One level Bathroom Shower/Tub: Forensic scientist: Standard Bathroom Accessibility: Yes How  Accessible: Accessible via walker Home Adaptive Equipment: None Additional  Comments: wife with recent ACDF surg and limited (A) Prior Function Level of Independence: Independent Able to Take Stairs?: Yes Driving: Yes Vocation:  (truck driver) Communication Communication: No difficulties Dominant Hand: Right         Vision/Perception     Cognition  Overall Cognitive Status: Appears within functional limits for tasks assessed/performed Arousal/Alertness: Awake/alert Orientation Level: Appears intact for tasks assessed Behavior During Session: [facility] for tasks performed    Extremity/Trunk Assessment Right Upper Extremity Assessment RUE ROM/Strength/Tone: Within functional levels Left Upper Extremity Assessment LUE ROM/Strength/Tone: Within functional levels     Mobility Transfers Transfers: Sit to Stand;Stand to Sit Sit to Stand: 4: Min assist;With upper extremity assist;From chair/3-in-1 Stand to Sit: 3: Mod assist;With upper extremity assist;Other (comment) (w/c and shower seat) Details for Transfer Assistance: pt required min v/c for hand placement. pt very slow moving and decreased ability to pivot on Lt foot. Pt required Mod (A) to facilitation pivot at the end of session with incr fatigue.     Shoulder Instructions     Exercise     Balance     End of Session OT - End of Session Activity Tolerance: Patient limited by fatigue;Patient limited by pain Patient left: in chair;with call bell/phone within reach Nurse Communication: Mobility status;Precautions;Other (comment) Richardo Hanks present for all vitals)  GO     Harrel Carina [facility] 05/01/2012, 10:36 AM Pager: [phone]

## 2012-05-01 NOTE — Progress Notes (Signed)
Agree with PT Treatment Note.  Stewart Sasaki, PT DPT [phone]  

## 2012-05-01 NOTE — Progress Notes (Signed)
Orthopaedic Trauma Service (OTS)  Subjective: 2 Days Post-Op Procedure(s) (LRB): INTRAMEDULLARY (IM) NAIL TIBIAL (Right) OPEN REDUCTION INTERNAL FIXATION (ORIF) ANKLE FRACTURE (Right) Patient reports pain as mild and moderate.   Working with OT at time of eval 0900 this am but afterward did have some SOB and malaise with transient increase in BP and HR; just spoke with patient and nurse who report he is now comfortable without any SOB/CP/ or malaise. Progressed well w PT yesterday.  12 lead complete, chem, CBC, trop, ckmb pending. Received aspirin and morphine. Cardiologist is Dr. Earna Coder in [place] but patient has already decided to change and has been seeking service in [place].  Objective: Current Vitals Blood pressure 130/76, pulse 100, temperature 98.6 F (37 C), temperature source Oral, resp. rate 20, height 6\' 2"  (1.88 m), weight 230 lb (104.327 kg), SpO2 100.00%. Vital signs in last 24 hours: Temp:  [98.2 F (36.8 C)-98.8 F (37.1 C)] 98.6 F (37 C) (10/02 0659) Pulse Rate:  [100-113] 100  (10/02 0659) Resp:  [18-20] 20  (10/02 0659) BP: (111-130)/(67-78) 130/76 mmHg (10/02 0659) SpO2:  [99 %-100 %] 100 % (10/02 0659)  Intake/Output from previous day: 10/01 0701 - 10/02 0700 In: 1200 [P.O.:1200] Out: 5050 [Urine:5050]  LABS No results found for this basename: HGB:5 in the last 72 hours No results found for this basename: WBC:2,RBC:2,HCT:2,PLT:2 in the last 72 hours No results found for this basename: NA:2,K:2,CL:2,CO2:2,BUN:2,CREATININE:2,GLUCOSE:2,CALCIUM:2 in the last 72 hours No results found for this basename: LABPT:2,INR:2 in the last 72 hours   Physical Exam  Wyn Forster which has been consistent finding since admission  Imaging Dg Tibia/fibula Right  04/29/2012  *RADIOLOGY REPORT*  Clinical Data: ORIF RIGHT TIBIA AND ANKLE.  DG C-ARM 61-120 MIN,RIGHT TIBIA AND FIBULA - 2 VIEW  Comparison: 04/27/2012  Findings: Intramedullary nail within the right tibia.   The locking proximal and distal screws.  Distal tibial screws also noted. Slight displacement across the tibial fragment.  Minimal displacement across the proximal fibular fracture.  IMPRESSION: Internal fixation of the tibial fractures.   Original Report Authenticated By: Cyndie Chime, M.D.    Dg Ankle 2 Views Right  04/29/2012  *RADIOLOGY REPORT*  Clinical Data: Tibial and fibular fractures.  RIGHT ANKLE - 2 VIEW  Comparison: April 27, 2012.  Findings: Four intraoperative fluoroscopic images of the right ankle demonstrate internal fixation of distal tibial fracture with good alignment of the fracture components.  IMPRESSION: See above.   Original Report Authenticated By: Venita Sheffield., M.D.    Dg Tibia/fibula Right Port  04/30/2012  *RADIOLOGY REPORT*  Clinical Data: Right tibia and fibular fracture fixation.  PORTABLE RIGHT TIBIA AND FIBULA - 2 VIEW  Comparison: Radiographs 04/27/2012.  Findings: There is an intramedullary rod in the tibia with two proximal and to distal interlocking screws.  This traverses the complex comminuted mid tibial shaft fracture with near anatomic alignment.  There are also two smaller cortical screws transfixing the medial malleolus fracture.  The ankle mortise is maintained. Proximal fibular fractures are unchanged  IMPRESSION:  1.  Internal fixation of the tibial shaft fractures and medial malleolus fracture with near anatomic alignment. 2.  Stable proximal fibular shaft fractures.   Original Report Authenticated By: P. Loralie Champagne, M.D.    Dg C-arm 61-120 Min  04/29/2012  *RADIOLOGY REPORT*  Clinical Data: ORIF RIGHT TIBIA AND ANKLE.  DG C-ARM 61-120 MIN,RIGHT TIBIA AND FIBULA - 2 VIEW  Comparison: 04/27/2012  Findings: Intramedullary nail within the right tibia.  The locking proximal and distal screws.  Distal tibial screws also noted. Slight displacement across the tibial fragment.  Minimal displacement across the proximal fibular fracture.  IMPRESSION:  Internal fixation of the tibial fractures.   Original Report Authenticated By: Cyndie Chime, M.D.     Assessment/Plan: 2 Days Post-Op Procedure(s) (LRB): INTRAMEDULLARY (IM) NAIL TIBIAL (Right) OPEN REDUCTION INTERNAL FIXATION (ORIF) ANKLE FRACTURE (Right)  Patient Active Hospital Problem List: No active hospital problems.  PLAN: F/u on studies, no active issues currently afib remains rate controlled and consistent since admission Cards consult Holding D/C  05/01/2012, 11:32 AM

## 2012-05-01 NOTE — Progress Notes (Signed)
Pt has had two episodes of weakness and feeling faint. o2 sats are 98-100% on room air. Heart rate is jumping from 68-131, patient has a history of afib.  Patient states he has been taking his own blood pressure medication daily, but has not taken it today. BP 143/88. Patient states he is just out of breath but not trouble breathing. MD notified. Will wait for orders.

## 2012-05-02 DIAGNOSIS — I4891 Unspecified atrial fibrillation: Secondary | ICD-10-CM

## 2012-05-02 DIAGNOSIS — I517 Cardiomegaly: Secondary | ICD-10-CM

## 2012-05-02 LAB — LIPID PANEL
Cholesterol: 168 mg/dL (ref 0–200)
HDL: 30 mg/dL — ABNORMAL LOW (ref >39)
Total CHOL/HDL Ratio: 5.6 RATIO
VLDL: 38 mg/dL (ref 0–40)

## 2012-05-02 LAB — HEMOGLOBIN A1C
Hgb A1c MFr Bld: 5.4 % (ref <5.7)
Mean Plasma Glucose: 108 mg/dL (ref <117)

## 2012-05-02 MED ORDER — METOPROLOL TARTRATE 12.5 MG HALF TABLET: 12.5000 mg | ORAL_TABLET | Freq: Two times a day (BID) | ORAL | Status: DC

## 2012-05-02 MED ORDER — DIPHENHYDRAMINE HCL 25 MG PO CAPS
25.0000 mg | ORAL_CAPSULE | Freq: Four times a day (QID) | ORAL | Status: DC | PRN
  Administered 2012-05-02: 25 mg via ORAL
  Filled 2012-05-02: qty 1

## 2012-05-02 NOTE — Progress Notes (Signed)
  Echocardiogram 2D Echocardiogram has been performed.  [name] Love 05/02/2012, 9:43 AM

## 2012-05-02 NOTE — Progress Notes (Signed)
Physical Therapy Treatment Patient Details Name: [name] Love MRN: [id number] DOB: May 22, 1961 Today's Date: 05/02/2012 Time: 9563-8756 PT Time Calculation (min): 31 min  PT Assessment / Plan / Recommendation Comments on Treatment Session  pt rpesents with R ankel and Tib fx.  pt with improved mobility today, however RN entered room during PT noting pt's HR increased to 140's during mobility.  pt notes no c/o dizziness or diaphoresis as he did yesterday.      Follow Up Recommendations  Home health PT    Barriers to Discharge        Equipment Recommendations  Rolling walker with 5" wheels;3 in 1 bedside comode;Wheelchair (measurements)    Recommendations for Other Services    Frequency Min 5X/week   Plan Discharge plan remains appropriate;Frequency remains appropriate    Precautions / Restrictions Precautions Precautions: Fall Restrictions Weight Bearing Restrictions: Yes RLE Weight Bearing: Non weight bearing   Pertinent Vitals/Pain Indicates pain when R LE lowered off of pillows.  Premedicated by RN.      Mobility  Bed Mobility Bed Mobility: Supine to Sit;Sitting - Scoot to Edge of Bed Supine to Sit: 4: Min assist Sitting - Scoot to Edge of Bed: 4: Min assist Details for Bed Mobility Assistance: A with holding R LE only.   Transfers Transfers: Sit to Stand;Stand to [employer] Transfers Sit to Stand: 4: Min assist;With upper extremity assist;From bed Stand to Sit: 4: Min assist;With upper extremity assist;To chair/3-in-1;With armrests Stand Pivot Transfers: 4: Min assist Details for Transfer Assistance: cues for sequencing SPT, positioning R LE.   Ambulation/Gait Ambulation/Gait Assistance: 4: Min assist Ambulation Distance (Feet): 2 Feet (3 hops forward) Assistive device: Rolling walker Ambulation/Gait Assistance Details: cues for sequencing, positioning R LE, use of RW.   Gait Pattern: Step-to pattern Stairs: No Wheelchair Mobility Wheelchair Mobility: No    Exercises     PT Diagnosis:    PT Problem List:   PT Treatment Interventions:     PT Goals Acute Rehab PT Goals Time For Goal Achievement: 05/07/12 PT Goal: Supine/Side to Sit - Progress: Progressing toward goal PT Goal: Sit to Stand - Progress: Progressing toward goal PT Goal: Stand to Sit - Progress: Progressing toward goal PT Transfer Goal: Bed to Chair/Chair to Bed - Progress: Progressing toward goal PT Goal: Ambulate - Progress: Progressing toward goal  Visit Information  Last PT Received On: 05/02/12 Assistance Needed: +1    Subjective Data  Subjective: I think my heart rate goes up when I'm worried about the pain.     Cognition  Overall Cognitive Status: Appears within functional limits for tasks assessed/performed Arousal/Alertness: Awake/alert Orientation Level: Appears intact for tasks assessed Behavior During Session: [facility] for tasks performed    Balance  Balance Balance Assessed: No  End of Session PT - End of Session Equipment Utilized During Treatment: Gait belt Activity Tolerance: Patient limited by fatigue Patient left: in chair;with call bell/phone within reach Nurse Communication: Mobility status   GP     Sunny Schlein,  [phone] 05/02/2012, 1:38 PM

## 2012-05-02 NOTE — Progress Notes (Signed)
Reviewed discharge instructions with patient and wife, they stated their understanding.  Reviewed administration of lovenox injections with patient's wife, she stated she understood, watched me administer and felt comfortable to administer at home.  Cam shoe applied by ortho tech.  Patient discharged via wheelchair home with wife.  Colman Cater

## 2012-05-02 NOTE — Progress Notes (Signed)
Orthopedic Tech Progress Note Patient Details:  [name] Love September 05, 1960 [id number]  Ortho Devices Type of Ortho Device: CAM walker Ortho Device/Splint Location: right foot Ortho Device/Splint Interventions: Application   Soma Bachand 05/02/2012, 9:11 PM

## 2012-05-02 NOTE — Care Management Note (Signed)
    Page 1 of 2   05/02/2012     2:30:50 PM   CARE MANAGEMENT NOTE 05/02/2012  Patient:  [name] Love, [name] Love   Account Number:  [id number]  Date Initiated:  05/01/2012  Documentation initiated by:  Anette Guarneri  Subjective/Objective Assessment:   ORIF left tibial plafond fx  HH services  DME     Action/Plan:   Home with HH services   Anticipated DC Date:  05/02/2012   Anticipated DC Plan:  HOME W HOME HEALTH SERVICES      DC Planning Services  CM consult      PAC Choice  DURABLE MEDICAL EQUIPMENT  HOME HEALTH   Choice offered to / List presented to:  C-1 Patient   DME arranged  3-N-1  WALKER - ROLLING  WHEELCHAIR - MANUAL      DME agency  [facility]     HH arranged  HH-2 PT      [facility] agency  [facility]   Status of service:  Completed, signed off Medicare Important Message given?  NO (If response is "NO", the following Medicare IM given date fields will be blank) Date Medicare IM given:   Date Additional Medicare IM given:    Discharge Disposition:  HOME W HOME HEALTH SERVICES  Per UR Regulation:  Reviewed for med. necessity/level of care/duration of stay  If discussed at Long Length of Stay Meetings, dates discussed:    Comments:  05-02-12 [address] Tomi Bamberger, RN,BSn [phone] CM did ask pt choice for [facility] PT. Pt chose [facility]  for services. Referral made for above services and dme to be delivered to room.

## 2012-05-02 NOTE — Progress Notes (Signed)
   Subjective:  Now on 3W telemetry.  Feels well.  No chest pain or dyspnea. No further dizzy spells.  Objective:  Vital Signs in the last 24 hours: Temp:  [97.9 F (36.6 C)-98.9 F (37.2 C)] 98 F (36.7 C) (10/03 0600) Pulse Rate:  [103-112] 103  (10/03 0600) Resp:  [20] 20  (10/03 0600) BP: (114-123)/(72-84) 114/73 mmHg (10/03 0600) SpO2:  [98 %-100 %] 98 % (10/03 0600)  Intake/Output from previous day: 10/02 0701 - 10/03 0700 In: 240 [P.O.:240] Out: 200 [Urine:200] Intake/Output from this shift:       . aspirin EC  81 mg Oral Daily  . enoxaparin (LOVENOX) injection  40 mg Subcutaneous Once  . enoxaparin (LOVENOX) injection  40 mg Subcutaneous Q24H  . metoprolol tartrate  12.5 mg Oral BID  . DISCONTD: aspirin EC  325 mg Oral Daily      . sodium chloride 125 mL/hr at 04/30/12 1021    Physical Exam: The patient appears to be in no distress.  Head and neck exam reveals that the pupils are equal and reactive.  The extraocular movements are full.  There is no scleral icterus.  Mouth and pharynx are benign.  No lymphadenopathy.  No carotid bruits.  The jugular venous pressure is normal.  Thyroid is not enlarged or tender.  Chest is clear to percussion and auscultation.  No rales or rhonchi.  Expansion of the chest is symmetrical.  Heart reveals no abnormal lift or heave.  First and second heart sounds are normal.  There is no murmur gallop rub or click.  The abdomen is soft and nontender.  Bowel sounds are normoactive.  There is no hepatosplenomegaly or mass.  There are no abdominal bruits.  Extremities reveal no phlebitis or edema.  Pedal pulses are good.  There is no cyanosis or clubbing. Right leg immobilized.  Neurologic exam is normal strength and no lateralizing weakness.  No sensory deficits.  Integument reveals no rash  Lab Results:  Basename 05/01/12 1155  WBC 11.6*  HGB 11.8*  PLT 290    Basename 05/01/12 1155  NA 136  K 3.9  CL 97  CO2 30    GLUCOSE 131*  BUN 12  CREATININE 0.94    Basename 05/01/12 1155  TROPONINI <0.30   Hepatic Function Panel No results found for this basename: PROT,ALBUMIN,AST,ALT,ALKPHOS,BILITOT,BILIDIR,IBILI in the last 72 hours No results found for this basename: CHOL in the last 72 hours No results found for this basename: PROTIME in the last 72 hours  Imaging: Imaging results have been reviewed. 2D echo not yet done.  Cardiac Studies: Telemetry reviewed.  Atrial fib with rates 90s. Assessment/Plan:  Patient Active Hospital Problem List: Atrial fibrillation (05/01/2012)   Assessment:Asymptomatic.   Plan: Await echo. Consider uptitration of beta blocker depending on heart rate response.            Okay to proceed with physical therapy.   LOS: 5 days    Cassell Clement 05/02/2012, 8:11 AM
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
## 2013-09-26 IMAGING — RF DG ANKLE 2V *R*
1 series · 4 of 4 positions shown · non-contrast
Comparison: April 27, 2012.

CLINICAL DATA: Tibial and fibular fractures.

RIGHT ANKLE - 2 VIEW

[Series 1: run · 4 of 4 slices shown]
[im 1/4]
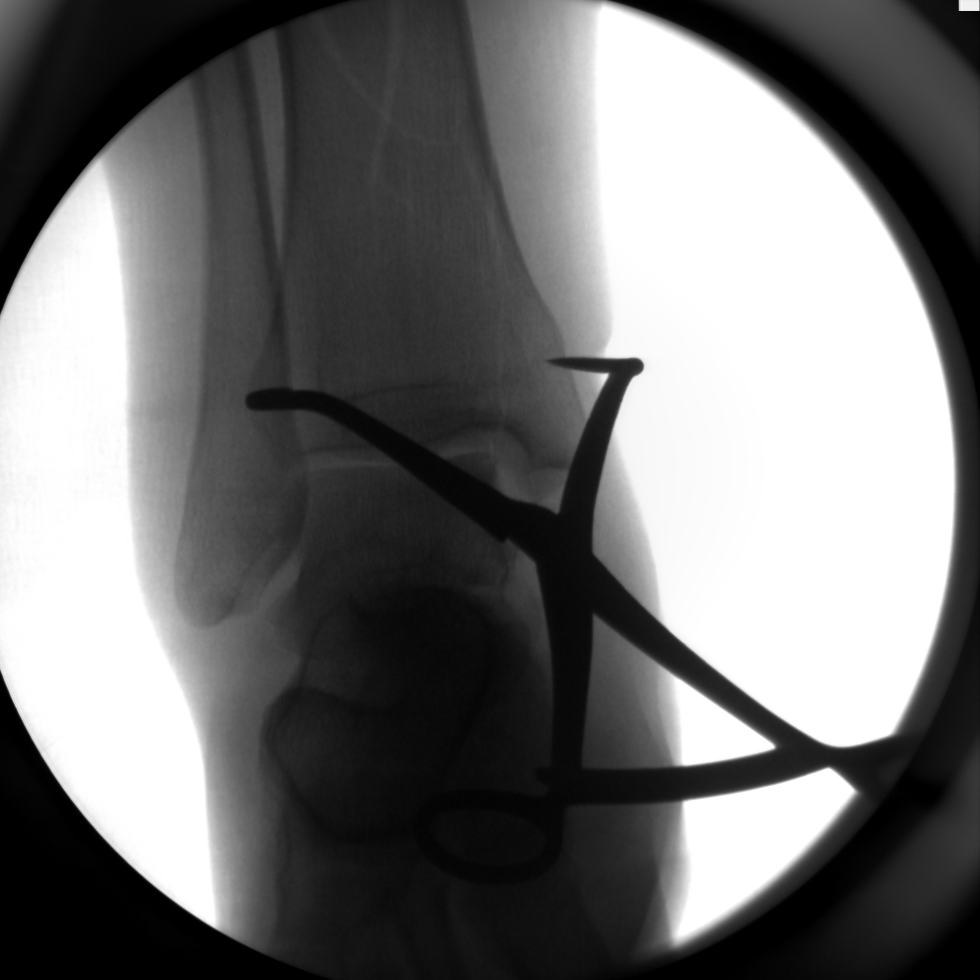
[im 2/4]
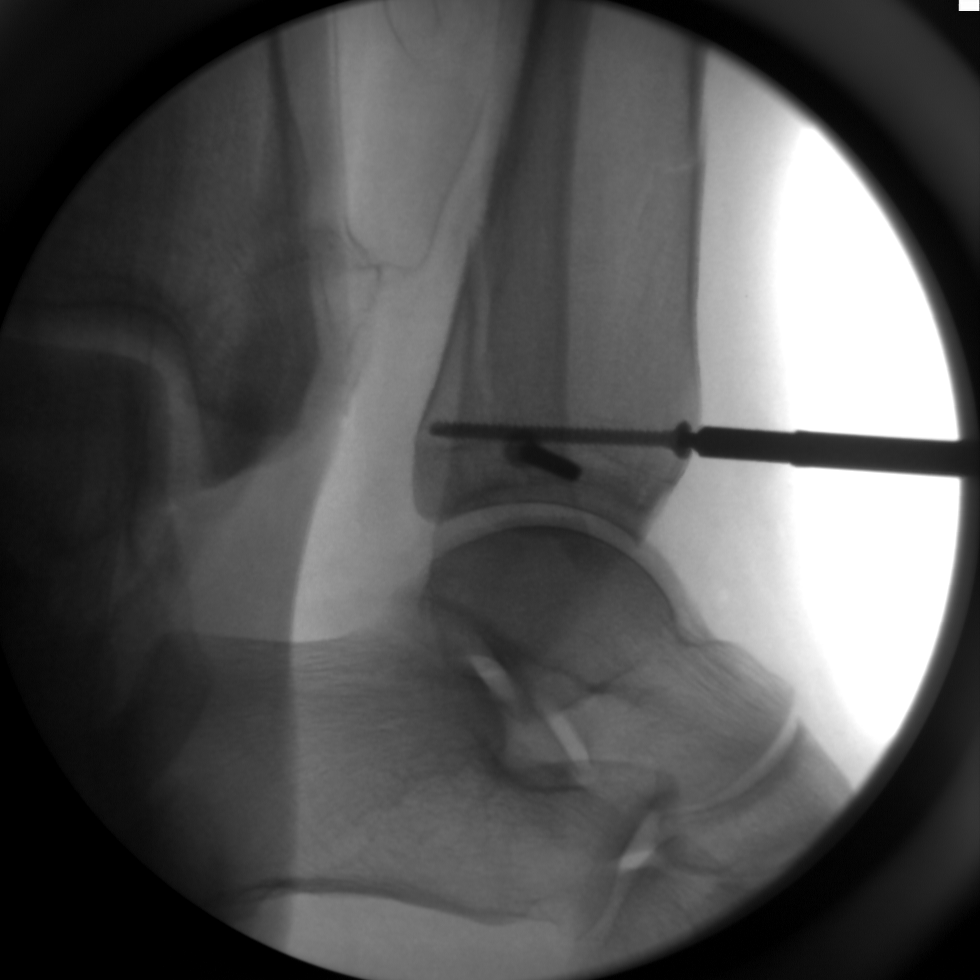
[im 3/4]
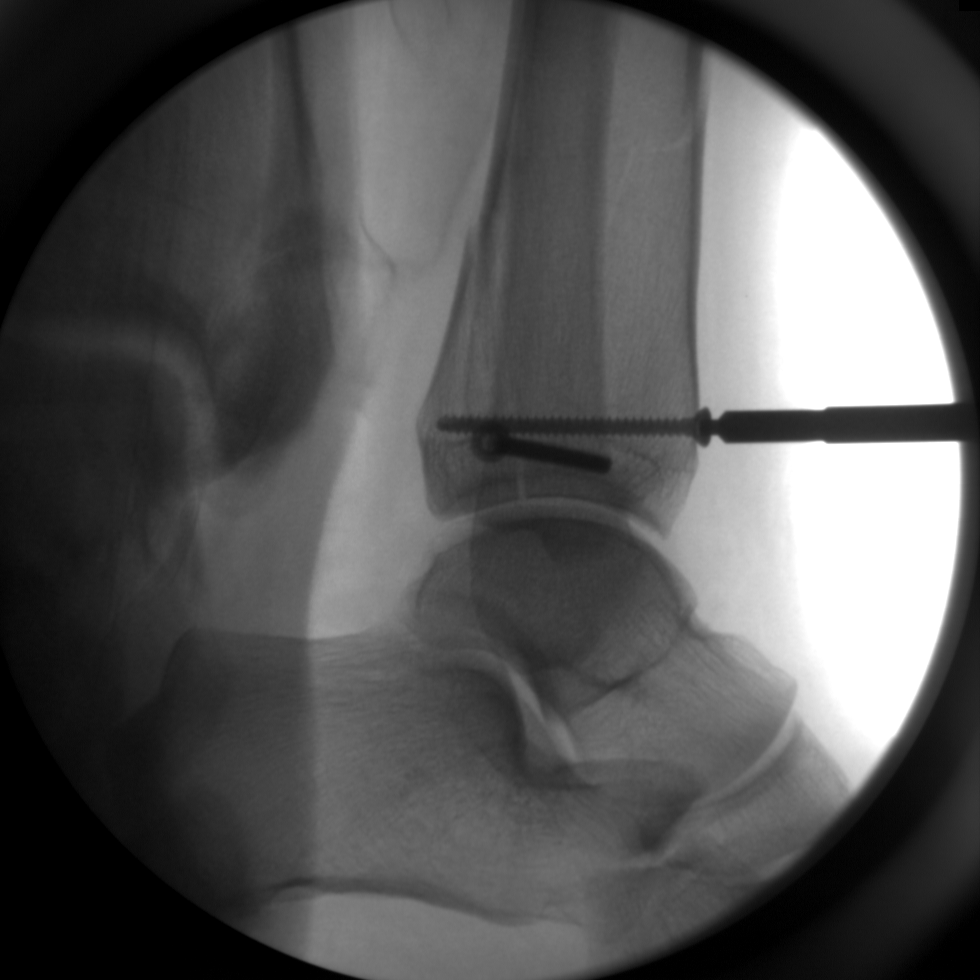
[im 4/4]
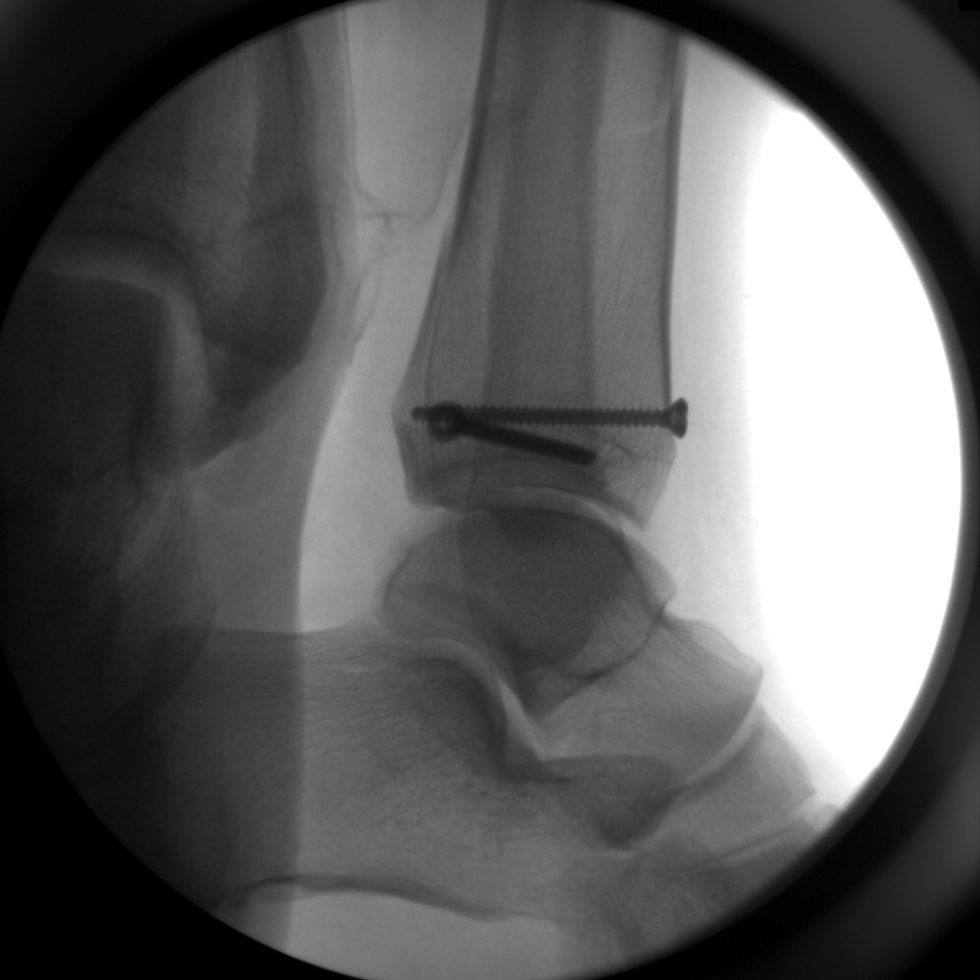

[4 of 4 positions shown; findings below may reference images not displayed]

FINDINGS: Four intraoperative fluoroscopic images of the right
ankle demonstrate internal fixation of distal tibial fracture with
good alignment of the fracture components.
IMPRESSION: See above.

## 2013-10-02 ENCOUNTER — Emergency Department (HOSPITAL_COMMUNITY): Payer: No Typology Code available for payment source

## 2013-10-02 ENCOUNTER — Encounter (HOSPITAL_COMMUNITY): Payer: Self-pay | Admitting: Emergency Medicine

## 2013-10-02 ENCOUNTER — Inpatient Hospital Stay (HOSPITAL_COMMUNITY)
Admission: EM | Admit: 2013-10-02 | Discharge: 2013-10-05 | DRG: 203 | Disposition: A | Discharge status: Home / Self Care | Payer: No Typology Code available for payment source | Attending: Internal Medicine | Admitting: Internal Medicine

## 2013-10-02 DIAGNOSIS — T50905A Adverse effect of unspecified drugs, medicaments and biological substances, initial encounter: Secondary | ICD-10-CM

## 2013-10-02 DIAGNOSIS — E785 Hyperlipidemia, unspecified: Secondary | ICD-10-CM | POA: Diagnosis present

## 2013-10-02 DIAGNOSIS — R600 Localized edema: Secondary | ICD-10-CM

## 2013-10-02 DIAGNOSIS — E669 Obesity, unspecified: Secondary | ICD-10-CM | POA: Diagnosis present

## 2013-10-02 DIAGNOSIS — T380X5A Adverse effect of glucocorticoids and synthetic analogues, initial encounter: Secondary | ICD-10-CM | POA: Diagnosis present

## 2013-10-02 DIAGNOSIS — E7800 Pure hypercholesterolemia, unspecified: Secondary | ICD-10-CM | POA: Diagnosis present

## 2013-10-02 DIAGNOSIS — R609 Edema, unspecified: Secondary | ICD-10-CM

## 2013-10-02 DIAGNOSIS — Z87891 Personal history of nicotine dependence: Secondary | ICD-10-CM

## 2013-10-02 DIAGNOSIS — J209 Acute bronchitis, unspecified: Principal | ICD-10-CM | POA: Diagnosis present

## 2013-10-02 DIAGNOSIS — E119 Type 2 diabetes mellitus without complications: Secondary | ICD-10-CM | POA: Diagnosis present

## 2013-10-02 DIAGNOSIS — E876 Hypokalemia: Secondary | ICD-10-CM | POA: Diagnosis present

## 2013-10-02 DIAGNOSIS — J449 Chronic obstructive pulmonary disease, unspecified: Secondary | ICD-10-CM

## 2013-10-02 DIAGNOSIS — I4891 Unspecified atrial fibrillation: Secondary | ICD-10-CM | POA: Diagnosis present

## 2013-10-02 DIAGNOSIS — D72829 Elevated white blood cell count, unspecified: Secondary | ICD-10-CM | POA: Diagnosis present

## 2013-10-02 DIAGNOSIS — Z7982 Long term (current) use of aspirin: Secondary | ICD-10-CM

## 2013-10-02 DIAGNOSIS — I10 Essential (primary) hypertension: Secondary | ICD-10-CM | POA: Diagnosis present

## 2013-10-02 DIAGNOSIS — Z8249 Family history of ischemic heart disease and other diseases of the circulatory system: Secondary | ICD-10-CM

## 2013-10-02 DIAGNOSIS — R739 Hyperglycemia, unspecified: Secondary | ICD-10-CM

## 2013-10-02 HISTORY — DX: Type 2 diabetes mellitus without complications: E11.9

## 2013-10-02 LAB — CBC WITH DIFFERENTIAL/PLATELET
BASOS ABS: 0.1 10*3/uL (ref 0.0–0.1)
Basophils Relative: 1 % (ref 0–1)
Eosinophils Absolute: 0.7 10*3/uL (ref 0.0–0.7)
Eosinophils Relative: 7 % — ABNORMAL HIGH (ref 0–5)
HEMATOCRIT: 37.8 % — AB (ref 39.0–52.0)
Hemoglobin: 13.1 g/dL (ref 13.0–17.0)
LYMPHS PCT: 18 % (ref 12–46)
Lymphs Abs: 1.8 10*3/uL (ref 0.7–4.0)
MCH: 33.6 pg (ref 26.0–34.0)
MCHC: 34.7 g/dL (ref 30.0–36.0)
MCV: 96.9 fL (ref 78.0–100.0)
MONO ABS: 0.7 10*3/uL (ref 0.1–1.0)
Monocytes Relative: 7 % (ref 3–12)
NEUTROS ABS: 6.7 10*3/uL (ref 1.7–7.7)
Neutrophils Relative %: 67 % (ref 43–77)
PLATELETS: 259 10*3/uL (ref 150–400)
RBC: 3.9 MIL/uL — ABNORMAL LOW (ref 4.22–5.81)
RDW: 13.4 % (ref 11.5–15.5)
WBC: 10 10*3/uL (ref 4.0–10.5)

## 2013-10-02 LAB — COMPREHENSIVE METABOLIC PANEL
ALK PHOS: 52 U/L (ref 39–117)
ALT: 44 U/L (ref 0–53)
AST: 43 U/L — AB (ref 0–37)
Albumin: 3.5 g/dL (ref 3.5–5.2)
BILIRUBIN TOTAL: 0.8 mg/dL (ref 0.3–1.2)
BUN: 13 mg/dL (ref 6–23)
CHLORIDE: 105 meq/L (ref 96–112)
CO2: 28 meq/L (ref 19–32)
Calcium: 9 mg/dL (ref 8.4–10.5)
Creatinine, Ser: 0.93 mg/dL (ref 0.50–1.35)
GFR calc Af Amer: >90 mL/min (ref >90)
Glucose, Bld: 188 mg/dL — ABNORMAL HIGH (ref 70–99)
Potassium: 3.7 mEq/L (ref 3.7–5.3)
SODIUM: 143 meq/L (ref 137–147)
Total Protein: 6.8 g/dL (ref 6.0–8.3)

## 2013-10-02 LAB — PRO B NATRIURETIC PEPTIDE: Pro B Natriuretic peptide (BNP): 436.4 pg/mL — ABNORMAL HIGH (ref 0–125)

## 2013-10-02 LAB — PROTIME-INR
INR: 1.05 (ref 0.00–1.49)
Prothrombin Time: 13.5 seconds (ref 11.6–15.2)

## 2013-10-02 LAB — APTT: aPTT: 31 seconds (ref 24–37)

## 2013-10-02 LAB — TROPONIN I: Troponin I: <0.3 ng/mL (ref <0.30)

## 2013-10-02 MED ORDER — METHYLPREDNISOLONE SODIUM SUCC 125 MG IJ SOLR
125.0000 mg | Freq: Once | INTRAMUSCULAR | Status: AC
  Administered 2013-10-02: 125 mg via INTRAVENOUS
  Filled 2013-10-02: qty 2

## 2013-10-02 MED ORDER — LEVALBUTEROL HCL 0.63 MG/3ML IN NEBU
0.6300 mg | INHALATION_SOLUTION | Freq: Four times a day (QID) | RESPIRATORY_TRACT | Status: DC
  Administered 2013-10-03 – 2013-10-04 (×5): 0.63 mg via RESPIRATORY_TRACT
  Filled 2013-10-02 (×5): qty 3

## 2013-10-02 MED ORDER — ASPIRIN EC 81 MG PO TBEC
81.0000 mg | DELAYED_RELEASE_TABLET | Freq: Every day | ORAL | Status: DC
  Administered 2013-10-03 – 2013-10-05 (×3): 81 mg via ORAL
  Filled 2013-10-02 (×3): qty 1

## 2013-10-02 MED ORDER — DILTIAZEM HCL 100 MG IV SOLR
5.0000 mg/h | INTRAVENOUS | Status: DC
  Administered 2013-10-02: 5 mg/h via INTRAVENOUS
  Filled 2013-10-02: qty 100

## 2013-10-02 MED ORDER — DILTIAZEM HCL 25 MG/5ML IV SOLN
10.0000 mg | Freq: Once | INTRAVENOUS | Status: AC
  Administered 2013-10-02: 10 mg via INTRAVENOUS
  Filled 2013-10-02: qty 5

## 2013-10-02 MED ORDER — ACETAMINOPHEN 650 MG RE SUPP: 650.0000 mg | Freq: Four times a day (QID) | RECTAL | Status: DC | PRN

## 2013-10-02 MED ORDER — IPRATROPIUM BROMIDE 0.02 % IN SOLN
0.5000 mg | Freq: Four times a day (QID) | RESPIRATORY_TRACT | Status: DC
  Administered 2013-10-03 – 2013-10-04 (×5): 0.5 mg via RESPIRATORY_TRACT
  Filled 2013-10-02 (×5): qty 2.5

## 2013-10-02 MED ORDER — LEVOFLOXACIN 500 MG PO TABS
500.0000 mg | ORAL_TABLET | Freq: Every day | ORAL | Status: DC
  Administered 2013-10-03 – 2013-10-05 (×4): 500 mg via ORAL
  Filled 2013-10-02 (×4): qty 1

## 2013-10-02 MED ORDER — LEVALBUTEROL HCL 0.63 MG/3ML IN NEBU: 0.6300 mg | INHALATION_SOLUTION | RESPIRATORY_TRACT | Status: DC | PRN

## 2013-10-02 MED ORDER — ENOXAPARIN SODIUM 150 MG/ML ~~LOC~~ SOLN
1.0000 mg/kg | Freq: Two times a day (BID) | SUBCUTANEOUS | Status: DC
  Filled 2013-10-02 (×2): qty 1

## 2013-10-02 MED ORDER — ONDANSETRON HCL 4 MG PO TABS: 4.0000 mg | ORAL_TABLET | Freq: Four times a day (QID) | ORAL | Status: DC | PRN

## 2013-10-02 MED ORDER — SIMVASTATIN 20 MG PO TABS: 40.0000 mg | ORAL_TABLET | Freq: Every day | ORAL | Status: DC

## 2013-10-02 MED ORDER — ALBUTEROL SULFATE (2.5 MG/3ML) 0.083% IN NEBU: 5.0000 mg | INHALATION_SOLUTION | Freq: Once | RESPIRATORY_TRACT | Status: DC

## 2013-10-02 MED ORDER — SODIUM CHLORIDE 0.9 % IV SOLN
INTRAVENOUS | Status: AC
  Administered 2013-10-02: via INTRAVENOUS

## 2013-10-02 MED ORDER — IPRATROPIUM BROMIDE 0.02 % IN SOLN: 0.5000 mg | Freq: Once | RESPIRATORY_TRACT | Status: DC

## 2013-10-02 MED ORDER — ACETAMINOPHEN 325 MG PO TABS
650.0000 mg | ORAL_TABLET | Freq: Four times a day (QID) | ORAL | Status: DC | PRN
  Administered 2013-10-03: 650 mg via ORAL
  Filled 2013-10-02: qty 2

## 2013-10-02 MED ORDER — ALBUTEROL SULFATE (2.5 MG/3ML) 0.083% IN NEBU
2.5000 mg | INHALATION_SOLUTION | Freq: Once | RESPIRATORY_TRACT | Status: AC
  Administered 2013-10-02: 2.5 mg via RESPIRATORY_TRACT
  Filled 2013-10-02: qty 3

## 2013-10-02 MED ORDER — DILTIAZEM HCL 100 MG IV SOLR
5.0000 mg/h | INTRAVENOUS | Status: DC
  Administered 2013-10-03: 15 mg/h via INTRAVENOUS
  Filled 2013-10-02: qty 100

## 2013-10-02 MED ORDER — IPRATROPIUM-ALBUTEROL 0.5-2.5 (3) MG/3ML IN SOLN
3.0000 mL | Freq: Once | RESPIRATORY_TRACT | Status: AC
  Administered 2013-10-02: 3 mL via RESPIRATORY_TRACT
  Filled 2013-10-02: qty 3

## 2013-10-02 MED ORDER — SODIUM CHLORIDE 0.9 % IJ SOLN
3.0000 mL | Freq: Two times a day (BID) | INTRAMUSCULAR | Status: DC
  Administered 2013-10-03 – 2013-10-05 (×3): 3 mL via INTRAVENOUS

## 2013-10-02 MED ORDER — ONDANSETRON HCL 4 MG/2ML IJ SOLN: 4.0000 mg | Freq: Four times a day (QID) | INTRAMUSCULAR | Status: DC | PRN

## 2013-10-02 MED ORDER — METHYLPREDNISOLONE SODIUM SUCC 125 MG IJ SOLR
60.0000 mg | Freq: Four times a day (QID) | INTRAMUSCULAR | Status: DC
  Administered 2013-10-03 – 2013-10-04 (×5): 60 mg via INTRAVENOUS
  Filled 2013-10-02 (×5): qty 2

## 2013-10-02 NOTE — H&P (Addendum)
[facility] History and Physical  Rommel Hogston BPZ:[id number] DOB: Aug 23, 1960 DOA: 10/02/2013   PCP: Tempie Hoist, FNP in [place], [place] Specialists: None. Hasn't seen a cardiologist in 2 years  Chief Complaint: Shortness of breath and wheezing since Saturday  HPI: [name] Love is a 53 y.o. male with a past medical history of chronic atrial fibrillation just on aspirin, hypertension, hypercholesterolemia who was in his usual state of health of this past Saturday when he started having wheezing. This progressively got worse and he felt acutely short of breath today and so, he decided to come in to the hospital. He denies any chest pain. No nausea, vomiting, no fever. Has had some chills. Had a dry cough until today when he started noticing yellowish expectoration. Denies any blood in the sputum. Because of the cough and, wheezing he's not been able to sleep at night. He has also noticed swelling in his right ankle and right leg more than the left over the past couple days. Denies any dizziness. He's never had these symptoms before. He has smoked tobacco about 24 years ago, just for 3-4 years. No passive exposure. He is a Administrator and drives locally.  Home Medications: Prior to Admission medications   Medication Sig Start Date End Date Taking? Authorizing Provider  Ascorbic Acid (VITAMIN C) 1000 MG tablet Take 1,000 mg by mouth 2 (two) times daily.   Yes Historical Provider, MD  aspirin EC 81 MG tablet Take 81 mg by mouth daily.   Yes Historical Provider, MD  ibuprofen (ADVIL,MOTRIN) 800 MG tablet Take 800 mg by mouth every 8 (eight) hours as needed. pain 07/28/13  Yes Historical Provider, MD  lisinopril-hydrochlorothiazide (PRINZIDE,ZESTORETIC) 20-25 MG per tablet Take 1 tablet by mouth daily. 08/04/13  Yes Historical Provider, MD  Multiple Vitamin (MULTIVITAMIN) tablet Take 1 tablet by mouth daily.   Yes Historical Provider, MD  Red Yeast Rice Extract (RED YEAST RICE PO) Take  1 capsule by mouth 2 (two) times daily.   Yes Historical Provider, MD  simvastatin (ZOCOR) 40 MG tablet Take 40 mg by mouth daily. 09/29/13  Yes Historical Provider, MD    Allergies: No Known Allergies  Past Medical History: Past Medical History  Diagnosis Date  . Hypertension   . Atrial fibrillation     dx 1990s, failed DCCV  . History of tobacco abuse     quit 1991  . Hyperlipidemia     diet controlled, RYR, fish oil    Past Surgical History  Procedure Laterality Date  . Back surgery    . Tibia im nail insertion  04/29/2012    Procedure: INTRAMEDULLARY (IM) NAIL TIBIAL;  Surgeon: Rozanna Box, MD;  Location: [facility];  Service: Orthopedics;  Laterality: Right;  Dupuy nail, Biomet versanail, Synthes small fragment, Depuy small fragment  . Orif ankle fracture  04/29/2012    Procedure: OPEN REDUCTION INTERNAL FIXATION (ORIF) ANKLE FRACTURE;  Surgeon: Rozanna Box, MD;  Location: [facility];  Service: Orthopedics;  Laterality: Right;    Social History: Lives with his wife. The works as a Administrator. He drives locally. He drove on and off for about 10 hours today. Quit smoking 24 years ago. Occasional alcohol use. Denies illicit drug use. Usually independent with daily activities.  Family History:  Family History  Problem Relation Age of Onset  . Heart disease Mother     MI 68s     Review of Systems - History obtained from the patient General ROS: negative Psychological  ROS: negative Ophthalmic ROS: negative ENT ROS: negative Allergy and Immunology ROS: negative Hematological and Lymphatic ROS: negative Endocrine ROS: negative Respiratory ROS: as in hpi Cardiovascular ROS: as in hpi Gastrointestinal ROS: no abdominal pain, change in bowel habits, or black or bloody stools Genito-Urinary ROS: no dysuria, trouble voiding, or hematuria Musculoskeletal ROS: negative Neurological ROS: no TIA or stroke symptoms Dermatological ROS: negative  Physical Examination  Filed Vitals:    10/02/13 2035 10/02/13 2052 10/02/13 2100 10/02/13 2226  BP: 175/90  165/89 145/80  Pulse: 111  113 105  Temp: 98.8 F (37.1 C)     Resp: 20  9 20   Height: 6' 2"  (1.88 m)     Weight: 124.739 kg (275 lb)     SpO2: 93% 97% 100% 97%    BP 145/80  Pulse 105  Temp(Src) 98.8 F (37.1 C)  Resp 20  Ht 6' 2"  (1.88 m)  Wt 124.739 kg (275 lb)  BMI 35.29 kg/m2  SpO2 97%  General appearance: alert, cooperative, appears stated age, no distress and moderately obese Head: Normocephalic, without obvious abnormality, atraumatic Eyes: conjunctivae/corneas clear. PERRL, EOM's intact.  Throat: lips, mucosa, and tongue normal; teeth and gums normal Neck: no adenopathy, no carotid bruit, no JVD, supple, symmetrical, trachea midline and thyroid not enlarged, symmetric, no tenderness/mass/nodules Resp: Diffuse end expiratory wheezing bilaterally, without any crackles. Cardio: S1-S2 is irregularly, irregular, tachycardic. No S3, S4. No rubs, murmurs, or bruit. Bilateral pedal edema, right greater than left noted. Pitting. GI: soft, non-tender; bowel sounds normal; no masses,  no organomegaly Extremities: Bilateral pitting pedal edema. No calf tenderness. No erythema Pulses: 2+ and symmetric Skin: Skin color, texture, turgor normal. No rashes or lesions Lymph nodes: Cervical, supraclavicular, and axillary nodes normal. Neurologic: Alert and oriented x3. No focal neurological deficits are present.  Laboratory Data: Results for orders placed during the hospital encounter of 10/02/13 (from the past 48 hour(s))  CBC WITH DIFFERENTIAL     Status: Abnormal   Collection Time    10/02/13  8:35 PM      Result Value Ref Range   WBC 10.0  4.0 - 10.5 K/uL   RBC 3.90 (*) 4.22 - 5.81 MIL/uL   Hemoglobin 13.1  13.0 - 17.0 g/dL   HCT 37.8 (*) 39.0 - 52.0 %   MCV 96.9  78.0 - 100.0 fL   MCH 33.6  26.0 - 34.0 pg   MCHC 34.7  30.0 - 36.0 g/dL   RDW 13.4  11.5 - 15.5 %   Platelets 259  150 - 400 K/uL    Neutrophils Relative % 67  43 - 77 %   Neutro Abs 6.7  1.7 - 7.7 K/uL   Lymphocytes Relative 18  12 - 46 %   Lymphs Abs 1.8  0.7 - 4.0 K/uL   Monocytes Relative 7  3 - 12 %   Monocytes Absolute 0.7  0.1 - 1.0 K/uL   Eosinophils Relative 7 (*) 0 - 5 %   Eosinophils Absolute 0.7  0.0 - 0.7 K/uL   Basophils Relative 1  0 - 1 %   Basophils Absolute 0.1  0.0 - 0.1 K/uL  COMPREHENSIVE METABOLIC PANEL     Status: Abnormal   Collection Time    10/02/13  8:35 PM      Result Value Ref Range   Sodium 143  137 - 147 mEq/L   Potassium 3.7  3.7 - 5.3 mEq/L   Chloride 105  96 - 112 mEq/L  CO2 28  19 - 32 mEq/L   Glucose, Bld 188 (*) 70 - 99 mg/dL   BUN 13  6 - 23 mg/dL   Creatinine, Ser 0.93  0.50 - 1.35 mg/dL   Calcium 9.0  8.4 - 10.5 mg/dL   Total Protein 6.8  6.0 - 8.3 g/dL   Albumin 3.5  3.5 - 5.2 g/dL   AST 43 (*) 0 - 37 U/L   ALT 44  0 - 53 U/L   Alkaline Phosphatase 52  39 - 117 U/L   Total Bilirubin 0.8  0.3 - 1.2 mg/dL   GFR calc non Af Amer >90  >90 mL/min   GFR calc Af Amer >90  >90 mL/min   Comment: (NOTE)     The eGFR has been calculated using the CKD EPI equation.     This calculation has not been validated in all clinical situations.     eGFR's persistently <90 mL/min signify possible Chronic Kidney     Disease.  PRO B NATRIURETIC PEPTIDE     Status: Abnormal   Collection Time    10/02/13  8:35 PM      Result Value Ref Range   Pro B Natriuretic peptide (BNP) 436.4 (*) 0 - 125 pg/mL  TROPONIN I     Status: None   Collection Time    10/02/13  8:35 PM      Result Value Ref Range   Troponin I <0.30  <0.30 ng/mL   Comment:            Due to the release kinetics of cTnI,     a negative result within the first hours     of the onset of symptoms does not rule out     myocardial infarction with certainty.     If myocardial infarction is still suspected,     repeat the test at appropriate intervals.    Radiology Reports: Dg Chest Port 1 View  10/02/2013   CLINICAL DATA:   Shortness of breath since Monday.  EXAM: PORTABLE CHEST - 1 VIEW  COMPARISON:  DG CHEST 1V PORT dated 04/27/2012  FINDINGS: The cardiac silhouette appears upper limits of normal in size, mediastinal silhouette is unremarkable. No pleural effusions or consolidations, though right costophrenic angle is incompletely imaged. No pneumothorax. Soft tissue planes and included osseous structures are nonsuspicious.  IMPRESSION: Borderline cardiomegaly, no acute pulmonary process.   Electronically Signed   By: Elon Alas   On: 10/02/2013 20:59    Electrocardiogram: Atrial fibrillation at 110 beats per minute. Normal axis. Intervals appear to be normal. Possible Q waves in V2 and V3. Nonspecific T wave, changes. No concerning ST changes.  Problem List  Principal Problem:   Acute bronchitis Active Problems:   Atrial fibrillation with RVR   Pedal edema   Obesity   HTN (hypertension), benign   Hypercholesteremia   Assessment: This is a 53 year old, Caucasian male, who presents with worsening shortness of breath since Saturday. He had significant wheezing on examination. This is most likely acute bronchitis. He also has lower extremity edema, right more than left without any erythema or calf tenderness. 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 After he was given nebulizer treatments in the ED, his heart rate went up to 150s. He has rapid A. Fib.  Plan: #1 acute bronchitis: He'll be given steroids, nebulizer treatments, and antibiotics. Given question was raised about possible DVT his last shortness of breath is less  likely to be due to pulmonary embolism as he predominantly presents with wheezing. Does not have any chest pain. Was not hypoxic.  #2 atrial fibrillation with rapid ventricular response: Heart rate is better now that he is a Cardizem infusion. We will continue the same. Transition him over to oral drugs when HR is better. He is not on any rate limiting agents at home.  Tachycardia is the most likely due to his respiratory illness. He is not on anticoagulation either. Continue aspirin.  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 We will proceed with venous Doppler in the morning. He'll be placed on full dose Lovenox for now.  #4 history of hypertension: Monitor blood pressures closely while he is on the Cardizem drip. We will hold his oral agents for now.  #5 history of hypercholesterolemia: Continue with statin.   DVT Prophylaxis: Full dose Lovenox for now Code Status: Full code Family Communication: Discussed with the patient  Disposition Plan: Admit to step down   Further management decisions will depend on results of further testing and patient's response to treatment.  [facility]  [facility] Pager [phone]  If 7PM-7AM, please contact night-coverage [email or web address] Password [facility]  10/02/2013, 10:49 PM

## 2013-10-02 NOTE — ED Notes (Signed)
Pt states breathing better at this time after breathing tx.

## 2013-10-02 NOTE — ED Notes (Signed)
Pt reporting SOB for past couple days.  Reports worse today.  Reporting mildly productive cough.  Audible wheeze during triage.

## 2013-10-02 NOTE — ED Provider Notes (Signed)
CSN: [id number]     Arrival date & time 10/02/13  2005 History  This chart was scribed for [name] LennertJoseph L Vikrant Pryce, MD by Bennett Scrapehristina Taylor, ED Scribe. This patient was seen in room APA02/APA02 and the patient's care was started at 8:31 PM.   Chief Complaint  Patient presents with  . Shortness of Breath    Patient is a 53 y.o. male presenting with shortness of breath. The history is provided by the patient. No language interpreter was used.  Shortness of Breath Onset quality:  Gradual Duration:  2 days Timing:  Constant Progression:  Worsening Chronicity:  New Associated symptoms: no abdominal pain, no chest pain, no cough, no fever, no headaches and no rash     HPI Comments: [name] Love is a 53 y.o. male with a h/o A. Fib who presents to the Emergency Department complaining of 2 days of gradually worsening SOB that started today with an associated cough that is mostly NP. He denies any prior episodes. He denies any CP, fevers or chills. He denies smoking.   Past Medical History  Diagnosis Date  . Hypertension   . Atrial fibrillation     dx 1990s, failed DCCV  . History of tobacco abuse     quit 1991  . Hyperlipidemia     diet controlled, RYR, fish oil   Past Surgical History  Procedure Laterality Date  . Back surgery    . Tibia im nail insertion  04/29/2012    Procedure: INTRAMEDULLARY (IM) NAIL TIBIAL;  Surgeon: Budd PalmerMichael H Handy, MD;  Location: [facility] OR;  Service: Orthopedics;  Laterality: Right;  Dupuy nail, Biomet versanail, Synthes small fragment, Depuy small fragment  . Orif ankle fracture  04/29/2012    Procedure: OPEN REDUCTION INTERNAL FIXATION (ORIF) ANKLE FRACTURE;  Surgeon: Budd PalmerMichael H Handy, MD;  Location: [facility] OR;  Service: Orthopedics;  Laterality: Right;   Family History  Problem Relation Age of Onset  . Heart disease Mother     MI 3650s   History  Substance Use Topics  . Smoking status: Former Smoker    Quit date: 07/31/1989  . Smokeless tobacco: Never Used  . Alcohol  Use: Yes     Comment: occassional beer    Review of Systems  Constitutional: Negative for fever, chills, appetite change and fatigue.  HENT: Negative for congestion, ear discharge and sinus pressure.   Eyes: Negative for discharge.  Respiratory: Positive for shortness of breath. Negative for cough.   Cardiovascular: Negative for chest pain.  Gastrointestinal: Negative for abdominal pain and diarrhea.  Genitourinary: Negative for frequency and hematuria.  Musculoskeletal: Negative for back pain.  Skin: Negative for rash.  Neurological: Negative for seizures and headaches.  Psychiatric/Behavioral: Negative for hallucinations.      Allergies  Review of patient's allergies indicates no known allergies.  Home Medications   Current Outpatient Rx  Name  Route  Sig  Dispense  Refill  . aspirin 81 MG tablet   Oral   Take 81 mg by mouth daily.         Marland Kitchen. enoxaparin (LOVENOX) 40 MG/0.4ML injection   Subcutaneous   Inject 0.4 mLs (40 mg total) into the skin once.   7 Syringe   0   . hydrochlorothiazide (HYDRODIURIL) 25 MG tablet   Oral   Take 25 mg by mouth daily.         Marland Kitchen. HYDROcodone-acetaminophen (NORCO/VICODIN) 5-325 MG per tablet   Oral   Take 1-2 tablets by mouth every 4 (four) hours  as needed for pain.   60 tablet   1   . metoprolol tartrate (LOPRESSOR) 12.5 mg TABS   Oral   Take 0.5 tablets (12.5 mg total) by mouth 2 (two) times daily.   120 tablet   1    Triage Vitals: BP 175/90  Pulse 111  Temp(Src) 98.8 F (37.1 C)  Resp 20  Ht 6\' 2"  (1.88 m)  Wt 275 lb (124.739 kg)  BMI 35.29 kg/m2  SpO2 93%  Physical Exam  Nursing note and vitals reviewed. Constitutional: He is oriented to person, place, and time. He appears well-developed and well-nourished.  HENT:  Head: Normocephalic and atraumatic.  Eyes: Conjunctivae and EOM are normal. No scleral icterus.  Neck: Neck supple. No thyromegaly present.  Cardiovascular: Normal rate and regular rhythm.  Exam  reveals no gallop and no friction rub.   No murmur heard. Pulmonary/Chest: Effort normal. No stridor. He has wheezes (moderately bilaterally ). He has no rales. He exhibits no tenderness.  Abdominal: Soft. He exhibits no distension.  Musculoskeletal: Normal range of motion. He exhibits no edema.  Lymphadenopathy:    He has no cervical adenopathy.  Neurological: He is alert and oriented to person, place, and time. He exhibits normal muscle tone. Coordination normal.  Skin: Skin is warm and dry. No rash noted. No erythema.  Psychiatric: He has a normal mood and affect. His behavior is normal.    ED Course  Procedures (including critical care time)  Medications  diltiazem (CARDIZEM) injection 10 mg (not administered)  diltiazem (CARDIZEM) 100 mg in dextrose 5 % 100 mL infusion (not administered)  methylPREDNISolone sodium succinate (SOLU-MEDROL) 125 mg/2 mL injection 125 mg (not administered)  ipratropium-albuterol (DUONEB) 0.5-2.5 (3) MG/3ML nebulizer solution 3 mL (3 mLs Nebulization Given 10/02/13 2052)  albuterol (PROVENTIL) (2.5 MG/3ML) 0.083% nebulizer solution 2.5 mg (2.5 mg Nebulization Given 10/02/13 2052)    DIAGNOSTIC STUDIES: Oxygen Saturation is 93% on RA, adequate by my interpretation.    COORDINATION OF CARE: 8:33 PM-Discussed treatment plan which includes breathing tx, CXR, CBC panel, CMP and troponin with pt at bedside and pt agreed to plan.   9:24 PM-Pt rechecked and wheezing is improved on re-exam. Pt is feeling better with breathing tx. However, pt now has a HR in the 140s. Will seek admission and start Cardizem IV drip.   Labs Review Labs Reviewed - No data to display Imaging Review No results found.   EKG Interpretation   Date/Time:  Thursday October 02 2013 20:41:22 EST Ventricular Rate:  110 PR Interval:    QRS Duration: 90 QT Interval:  330 QTC Calculation: 446 R Axis:   -22 Text Interpretation:  Atrial fibrillation with rapid ventricular response   Anteroseptal infarct , age undetermined Abnormal ECG When compared with  ECG of 01-May-2012 10:07, Anteroseptal infarct is now Present Nonspecific  T wave abnormality now evident in Anterior leads Confirmed by Kylen Ismael  MD,  Davan Hark [phone]) on 10/02/2013 10:32:19 PM     CRITICAL CARE Performed by: Braiden Presutti L Total critical care time: 41 Critical care time was exclusive of separately billable procedures and treating other patients. Critical care was necessary to treat or prevent imminent or life-threatening deterioration. Critical care was time spent personally by me on the following activities: development of treatment plan with patient and/or surrogate as well as nursing, discussions with consultants, evaluation of patient's response to treatment, examination of patient, obtaining history from patient or surrogate, ordering and performing treatments and interventions, ordering and review of laboratory  studies, ordering and review of radiographic studies, pulse oximetry and re-evaluation of patient's condition.  MDM   Final diagnoses:  None    The chart was scribed for me under my direct supervision.  I personally performed the history, physical, and medical decision making and all procedures in the evaluation of this patient.[name] Lennert, MD 10/02/13 2232

## 2013-10-03 ENCOUNTER — Inpatient Hospital Stay (HOSPITAL_COMMUNITY): Payer: No Typology Code available for payment source

## 2013-10-03 DIAGNOSIS — R7309 Other abnormal glucose: Secondary | ICD-10-CM

## 2013-10-03 DIAGNOSIS — R739 Hyperglycemia, unspecified: Secondary | ICD-10-CM | POA: Diagnosis present

## 2013-10-03 DIAGNOSIS — T50904A Poisoning by unspecified drugs, medicaments and biological substances, undetermined, initial encounter: Secondary | ICD-10-CM

## 2013-10-03 DIAGNOSIS — T50905A Adverse effect of unspecified drugs, medicaments and biological substances, initial encounter: Secondary | ICD-10-CM | POA: Diagnosis present

## 2013-10-03 LAB — HEMOGLOBIN A1C
Hgb A1c MFr Bld: 7.1 % — ABNORMAL HIGH (ref <5.7)
MEAN PLASMA GLUCOSE: 157 mg/dL — AB (ref <117)

## 2013-10-03 LAB — GLUCOSE, CAPILLARY
GLUCOSE-CAPILLARY: 202 mg/dL — AB (ref 70–99)
GLUCOSE-CAPILLARY: 212 mg/dL — AB (ref 70–99)
GLUCOSE-CAPILLARY: 183 mg/dL — AB (ref 70–99)
GLUCOSE-CAPILLARY: 228 mg/dL — AB (ref 70–99)

## 2013-10-03 LAB — CBC
HCT: 40.1 % (ref 39.0–52.0)
Hemoglobin: 13.5 g/dL (ref 13.0–17.0)
MCH: 32.7 pg (ref 26.0–34.0)
MCHC: 33.7 g/dL (ref 30.0–36.0)
MCV: 97.1 fL (ref 78.0–100.0)
Platelets: 273 10*3/uL (ref 150–400)
RBC: 4.13 MIL/uL — ABNORMAL LOW (ref 4.22–5.81)
RDW: 13.5 % (ref 11.5–15.5)
WBC: 6.7 10*3/uL (ref 4.0–10.5)

## 2013-10-03 LAB — COMPREHENSIVE METABOLIC PANEL
ALBUMIN: 3.8 g/dL (ref 3.5–5.2)
ALK PHOS: 52 U/L (ref 39–117)
ALT: 45 U/L (ref 0–53)
AST: 41 U/L — ABNORMAL HIGH (ref 0–37)
BUN: 12 mg/dL (ref 6–23)
CO2: 27 mEq/L (ref 19–32)
Calcium: 9.1 mg/dL (ref 8.4–10.5)
Chloride: 106 mEq/L (ref 96–112)
Creatinine, Ser: 0.83 mg/dL (ref 0.50–1.35)
GFR calc Af Amer: >90 mL/min (ref >90)
GFR calc non Af Amer: >90 mL/min (ref >90)
Glucose, Bld: 221 mg/dL — ABNORMAL HIGH (ref 70–99)
Potassium: 3.3 mEq/L — ABNORMAL LOW (ref 3.7–5.3)
SODIUM: 144 meq/L (ref 137–147)
TOTAL PROTEIN: 7.4 g/dL (ref 6.0–8.3)
Total Bilirubin: 0.9 mg/dL (ref 0.3–1.2)

## 2013-10-03 LAB — MRSA PCR SCREENING: MRSA by PCR: NEGATIVE

## 2013-10-03 LAB — TSH: TSH: 0.659 u[IU]/mL (ref 0.350–4.500)

## 2013-10-03 MED ORDER — ATORVASTATIN CALCIUM 20 MG PO TABS
20.0000 mg | ORAL_TABLET | Freq: Every day | ORAL | Status: DC
  Administered 2013-10-03 – 2013-10-04 (×2): 20 mg via ORAL
  Filled 2013-10-03 (×2): qty 1

## 2013-10-03 MED ORDER — INSULIN ASPART 100 UNIT/ML ~~LOC~~ SOLN
0.0000 [IU] | Freq: Every day | SUBCUTANEOUS | Status: DC
  Administered 2013-10-03: 2 [IU] via SUBCUTANEOUS

## 2013-10-03 MED ORDER — HYDROMORPHONE HCL PF 1 MG/ML IJ SOLN
1.0000 mg | INTRAMUSCULAR | Status: DC | PRN
  Administered 2013-10-03 – 2013-10-04 (×3): 1 mg via INTRAVENOUS
  Filled 2013-10-03 (×3): qty 1

## 2013-10-03 MED ORDER — DILTIAZEM HCL 60 MG PO TABS
60.0000 mg | ORAL_TABLET | Freq: Four times a day (QID) | ORAL | Status: DC
  Administered 2013-10-03 – 2013-10-05 (×9): 60 mg via ORAL
  Filled 2013-10-03 (×9): qty 1

## 2013-10-03 MED ORDER — ENOXAPARIN SODIUM 150 MG/ML ~~LOC~~ SOLN
125.0000 mg | Freq: Two times a day (BID) | SUBCUTANEOUS | Status: DC
  Administered 2013-10-03 (×2): 125 mg via SUBCUTANEOUS
  Filled 2013-10-03 (×8): qty 1

## 2013-10-03 MED ORDER — BENZONATATE 100 MG PO CAPS
100.0000 mg | ORAL_CAPSULE | Freq: Three times a day (TID) | ORAL | Status: DC | PRN
  Administered 2013-10-05: 100 mg via ORAL
  Filled 2013-10-03: qty 1

## 2013-10-03 MED ORDER — ENOXAPARIN SODIUM 150 MG/ML ~~LOC~~ SOLN: 120.0000 mg/kg | Freq: Two times a day (BID) | SUBCUTANEOUS | Status: DC

## 2013-10-03 MED ORDER — GUAIFENESIN 100 MG/5ML PO SOLN
5.0000 mL | ORAL | Status: DC | PRN
  Administered 2013-10-03 – 2013-10-05 (×6): 100 mg via ORAL
  Filled 2013-10-03 (×6): qty 5

## 2013-10-03 MED ORDER — ENOXAPARIN SODIUM 40 MG/0.4ML ~~LOC~~ SOLN
40.0000 mg | SUBCUTANEOUS | Status: DC
  Administered 2013-10-04 – 2013-10-05 (×2): 40 mg via SUBCUTANEOUS
  Filled 2013-10-03 (×2): qty 0.4

## 2013-10-03 MED ORDER — INSULIN ASPART 100 UNIT/ML ~~LOC~~ SOLN
0.0000 [IU] | Freq: Three times a day (TID) | SUBCUTANEOUS | Status: DC
  Administered 2013-10-03 (×2): 7 [IU] via SUBCUTANEOUS
  Administered 2013-10-03: 4 [IU] via SUBCUTANEOUS
  Administered 2013-10-04: 7 [IU] via SUBCUTANEOUS

## 2013-10-03 MED ORDER — ENOXAPARIN SODIUM 150 MG/ML ~~LOC~~ SOLN
SUBCUTANEOUS | Status: AC
Start: 2013-10-03 — End: 2013-10-03
  Filled 2013-10-03: qty 1

## 2013-10-03 MED ORDER — FAMOTIDINE 20 MG PO TABS
20.0000 mg | ORAL_TABLET | Freq: Every day | ORAL | Status: DC
  Administered 2013-10-03 – 2013-10-05 (×3): 20 mg via ORAL
  Filled 2013-10-03 (×3): qty 1

## 2013-10-03 MED ORDER — POTASSIUM CHLORIDE CRYS ER 20 MEQ PO TBCR
20.0000 meq | EXTENDED_RELEASE_TABLET | Freq: Two times a day (BID) | ORAL | Status: AC
  Administered 2013-10-03 – 2013-10-04 (×4): 20 meq via ORAL
  Filled 2013-10-03 (×4): qty 1

## 2013-10-03 MED ORDER — INSULIN DETEMIR 100 UNIT/ML ~~LOC~~ SOLN
10.0000 [IU] | Freq: Every day | SUBCUTANEOUS | Status: DC
  Administered 2013-10-03 – 2013-10-04 (×2): 10 [IU] via SUBCUTANEOUS
  Filled 2013-10-03 (×3): qty 0.1

## 2013-10-03 NOTE — Care Management Note (Signed)
    Page 1 of 1   10/03/2013     2:14:27 PM   CARE MANAGEMENT NOTE 10/03/2013  Patient:  [name] RumbleMARSHALL,[name]   Account Number:  [id number]  Date Initiated:  10/03/2013  Documentation initiated by:  Sharrie RothmanBLACKWELL,Albena Comes C  Subjective/Objective Assessment:   Pt admitted from home with a fib with rvr. Pt lives with his wife and will return home at discharge. Pt is independent with ADL's.     Action/Plan:   No Cm needs noted.   Anticipated DC Date:  10/05/2013   Anticipated DC Plan:  HOME/SELF CARE      DC Planning Services  CM consult      Choice offered to / List presented to:             Status of service:  Completed, signed off Medicare Important Message given?   (If response is "NO", the following Medicare IM given date fields will be blank) Date Medicare IM given:   Date Additional Medicare IM given:    Discharge Disposition:  HOME/SELF CARE  Per UR Regulation:    If discussed at Long Length of Stay Meetings, dates discussed:    Comments:  10/03/13 1415 Arlyss Queenammy Markevius Trombetta, RN BSN CM

## 2013-10-03 NOTE — Progress Notes (Addendum)
[facility] PROGRESS NOTE  Sonda RumbleDennis Kruer WUJ:[id number] DOB: [birth year] DOA: 10/02/2013 PCP: Delorse LekBlair, Diane W, FNP    Code Status: Full code Family Communication: Family not available Disposition Plan: Discharge to home clinically appropriate   Consultants:  None  Procedures:  None  Antibiotics:  Levaquin 10/02/13.  HPI/Subjective: The patient says he is breathing much better. He denies chest pain or pleurisy.  Objective: Filed Vitals:   10/03/13 0630  BP: 150/84  Pulse:   Temp:   Resp: 17   temperature 98.1. Heart rate 112. Respiratory rate 17. Blood pressure 150/84. Oxygen saturation 96%.  Intake/Output Summary (Last 24 hours) at 10/03/13 0838 Last data filed at 10/03/13 0600  Gross per 24 hour  Intake 508.08 ml  Output    350 ml  Net 158.08 ml   Filed Weights   10/02/13 2035 10/02/13 2341  Weight: 124.739 kg (275 lb) 129.4 kg (285 lb 4.4 oz)    Exam:   General:  Pleasant 53 year-old man sitting up in bed, in no acute distress.  Cardiovascular: Irregular, irregular, with tachycardia  Respiratory: Scattered expiratory wheezes, good air movement down to the bases. Breathing is nonlabored.  Abdomen: Obese, positive bowel sounds, soft, nontender, nondistended.  Musculoskeletal: No acute hot red joints. Trace to 1+ bilateral lower extremity edema, right greater than left.  Neurologic: He is alert and oriented x3. Cranial nerves II through XII are intact.   Data Reviewed: Basic Metabolic Panel:  Recent Labs Lab 10/02/13 2035 10/03/13 0422  NA 143 144  K 3.7 3.3*  CL 105 106  CO2 28 27  GLUCOSE 188* 221*  BUN 13 12  CREATININE 0.93 0.83  CALCIUM 9.0 9.1   Liver Function Tests:  Recent Labs Lab 10/02/13 2035 10/03/13 0422  AST 43* 41*  ALT 44 45  ALKPHOS 52 52  BILITOT 0.8 0.9  PROT 6.8 7.4  ALBUMIN 3.5 3.8   No results found for this basename: LIPASE, AMYLASE,  in the last 168 hours No results found for this basename: AMMONIA,   in the last 168 hours CBC:  Recent Labs Lab 10/02/13 2035 10/03/13 0422  WBC 10.0 6.7  NEUTROABS 6.7  --   HGB 13.1 13.5  HCT 37.8* 40.1  MCV 96.9 97.1  PLT 259 273   Cardiac Enzymes:  Recent Labs Lab 10/02/13 2035  TROPONINI <0.30   BNP (last 3 results)  Recent Labs  10/02/13 2035  PROBNP 436.4*   CBG:  Recent Labs Lab 10/03/13 0754  GLUCAP 202*    Recent Results (from the past 240 hour(s))  MRSA PCR SCREENING     Status: None   Collection Time    10/02/13 11:50 PM      Result Value Ref Range Status   MRSA by PCR NEGATIVE  NEGATIVE Final   Comment:            The GeneXpert MRSA Assay (FDA     approved for NASAL specimens     only), is one component of a     comprehensive MRSA colonization     surveillance program. It is not     intended to diagnose MRSA     infection nor to guide or     monitor treatment for     MRSA infections.     Studies: Dg Chest Port 1 View  10/02/2013   CLINICAL DATA:  Shortness of breath since Monday.  EXAM: PORTABLE CHEST - 1 VIEW  COMPARISON:  DG CHEST 1V PORT dated  04/27/2012  FINDINGS: The cardiac silhouette appears upper limits of normal in size, mediastinal silhouette is unremarkable. No pleural effusions or consolidations, though right costophrenic angle is incompletely imaged. No pneumothorax. Soft tissue planes and included osseous structures are nonsuspicious.  IMPRESSION: Borderline cardiomegaly, no acute pulmonary process.   Electronically Signed   By: Awilda Metro   On: 10/02/2013 20:59    Scheduled Meds: . aspirin EC  81 mg Oral Daily  . enoxaparin (LOVENOX) injection  125 mg Subcutaneous Q12H  . insulin aspart  0-20 Units Subcutaneous TID WC  . insulin aspart  0-5 Units Subcutaneous QHS  . insulin detemir  10 Units Subcutaneous QHS  . ipratropium  0.5 mg Nebulization Q6H  . levalbuterol  0.63 mg Nebulization Q6H  . levofloxacin  500 mg Oral Daily  . methylPREDNISolone (SOLU-MEDROL) injection  60 mg  Intravenous 4 times per day  . potassium chloride  20 mEq Oral BID  . simvastatin  40 mg Oral QPC supper  . sodium chloride  3 mL Intravenous Q12H   Continuous Infusions: . sodium chloride 50 mL/hr at 10/02/13 2342  . diltiazem (CARDIZEM) infusion 15 mg/hr (10/03/13 0835)   Assessment:  Principal Problem:   Acute bronchitis Active Problems:   Atrial fibrillation with RVR   Pedal edema   Obesity   HTN (hypertension), benign   Hypercholesteremia   Hyperglycemia, drug-induced   1. Acute bronchitis. We'll continue Xopenex/Atrovent nebulizers, Solu-Medrol, and Levaquin. Continue supportive treatment.  Chronic atrial fibrillation with rapid ventricular response. Rapid response likely exacerbated by acute bronchitis. We'll continue antiplatelet therapy. Lovenox started, but it will be discontinued if the lower extremity Doppler is negative for DVT. His heart rate is still elevated, so we'll continue diltiazem drip but will add oral diltiazem. Beta blocker not started secondary to his acute bronchospasms.  Right greater than left lower extremity edema. 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 However, DVT will need to be assessed. Lower extremity venous Doppler results are pending.  Hypertension. Currently stable. Lisinopril/HCTZ is being held as the patient is currently on a Cardizem drip.  Hyperglycemia, presumed to be steroid induced. The patient has no history of diabetes mellitus. Sliding scale NovoLog and Levemir have been started. Hemoglobin A1c is pending.  Hyperlipidemia. Continue statin therapy.  Mild hypokalemia. Potassium chloride supplementation started.     Plan: 1. We'll add oral diltiazem to the diltiazem drip. As his heart rate improves in the range of 70-100 consistently, we will wean off the diltiazem drip. 2. Check the results of the bilateral lower extremity venous Doppler when available. Continue Lovenox for now. If  they are negative, we'll discontinue Lovenox. 3. Wean steroids accordingly. 4. Check the results of TSH and hemoglobin A1c pending.  Time spent: 35 minutes.    [facility]  [facility] Pager [phone]. If 7PM-7AM, please contact night-coverage at [email or web address], password [email or web address] 10/03/2013, 8:38 AM  LOS: 1 day

## 2013-10-03 NOTE — Progress Notes (Signed)
UR chart review completed.  

## 2013-10-03 NOTE — Progress Notes (Signed)
Nursing staff was called into the room, the patient was complaining of a coughing spell prior and is now in severe pain on the right side. He states " I think I popped my rib while coughing". MD paged to make aware. Orders given and carried out. Will continue to monitor.

## 2013-10-04 ENCOUNTER — Encounter (HOSPITAL_COMMUNITY): Payer: Self-pay | Admitting: Internal Medicine

## 2013-10-04 DIAGNOSIS — E119 Type 2 diabetes mellitus without complications: Secondary | ICD-10-CM | POA: Diagnosis present

## 2013-10-04 HISTORY — DX: Type 2 diabetes mellitus without complications: E11.9

## 2013-10-04 LAB — CBC
HCT: 38 % — ABNORMAL LOW (ref 39.0–52.0)
Hemoglobin: 12.6 g/dL — ABNORMAL LOW (ref 13.0–17.0)
MCH: 32.7 pg (ref 26.0–34.0)
MCHC: 33.2 g/dL (ref 30.0–36.0)
MCV: 98.7 fL (ref 78.0–100.0)
PLATELETS: 280 10*3/uL (ref 150–400)
RBC: 3.85 MIL/uL — AB (ref 4.22–5.81)
RDW: 13.8 % (ref 11.5–15.5)
WBC: 14.9 10*3/uL — ABNORMAL HIGH (ref 4.0–10.5)

## 2013-10-04 LAB — GLUCOSE, CAPILLARY
GLUCOSE-CAPILLARY: 225 mg/dL — AB (ref 70–99)
GLUCOSE-CAPILLARY: 293 mg/dL — AB (ref 70–99)
Glucose-Capillary: 231 mg/dL — ABNORMAL HIGH (ref 70–99)
Glucose-Capillary: 259 mg/dL — ABNORMAL HIGH (ref 70–99)

## 2013-10-04 LAB — BASIC METABOLIC PANEL
BUN: 20 mg/dL (ref 6–23)
CALCIUM: 8.8 mg/dL (ref 8.4–10.5)
CO2: 26 meq/L (ref 19–32)
Chloride: 105 mEq/L (ref 96–112)
Creatinine, Ser: 0.97 mg/dL (ref 0.50–1.35)
GFR calc non Af Amer: >90 mL/min (ref >90)
Glucose, Bld: 238 mg/dL — ABNORMAL HIGH (ref 70–99)
Potassium: 3.9 mEq/L (ref 3.7–5.3)
SODIUM: 142 meq/L (ref 137–147)

## 2013-10-04 MED ORDER — INSULIN ASPART 100 UNIT/ML ~~LOC~~ SOLN
0.0000 [IU] | Freq: Three times a day (TID) | SUBCUTANEOUS | Status: DC
  Administered 2013-10-04: 8 [IU] via SUBCUTANEOUS
  Administered 2013-10-04: 5 [IU] via SUBCUTANEOUS
  Administered 2013-10-05 (×2): 3 [IU] via SUBCUTANEOUS

## 2013-10-04 MED ORDER — PREDNISONE 20 MG PO TABS
50.0000 mg | ORAL_TABLET | Freq: Two times a day (BID) | ORAL | Status: DC
  Administered 2013-10-04 – 2013-10-05 (×2): 50 mg via ORAL
  Filled 2013-10-04: qty 2
  Filled 2013-10-04: qty 1
  Filled 2013-10-04: qty 2
  Filled 2013-10-04: qty 1

## 2013-10-04 MED ORDER — METFORMIN HCL 500 MG PO TABS
500.0000 mg | ORAL_TABLET | Freq: Two times a day (BID) | ORAL | Status: DC
  Administered 2013-10-04 – 2013-10-05 (×2): 500 mg via ORAL
  Filled 2013-10-04 (×2): qty 1

## 2013-10-04 MED ORDER — LEVALBUTEROL HCL 0.63 MG/3ML IN NEBU
0.6300 mg | INHALATION_SOLUTION | Freq: Three times a day (TID) | RESPIRATORY_TRACT | Status: DC
  Administered 2013-10-04 – 2013-10-05 (×4): 0.63 mg via RESPIRATORY_TRACT
  Filled 2013-10-04 (×2): qty 3

## 2013-10-04 MED ORDER — LISINOPRIL 10 MG PO TABS
10.0000 mg | ORAL_TABLET | Freq: Every day | ORAL | Status: DC
  Administered 2013-10-04 – 2013-10-05 (×2): 10 mg via ORAL
  Filled 2013-10-04 (×2): qty 1

## 2013-10-04 MED ORDER — INSULIN ASPART 100 UNIT/ML ~~LOC~~ SOLN
0.0000 [IU] | Freq: Every day | SUBCUTANEOUS | Status: DC
  Administered 2013-10-04: 3 [IU] via SUBCUTANEOUS

## 2013-10-04 NOTE — Progress Notes (Signed)
Pt a/o.vss. Saline lock patent. No complaints of any distress. Up ad lib. [facility]. Pt to be transferred to room 334 via wheelchair.

## 2013-10-04 NOTE — Progress Notes (Signed)
[facility] PROGRESS NOTE  [name] Love ZOX:[id number] DOB: [birth year] DOA: 10/02/2013 PCP: Delorse Lek, FNP    Code Status: Full code Family Communication: Discussed with wife on 10/03/13. Disposition Plan: Discharge to home clinically appropriate   Consultants:  None  Procedures:  None  Antibiotics:  Levaquin 10/02/13.  HPI/Subjective: Last night, the patient had a coughing jag which closed severe pain on his right lower chest wall. He thought he had fractured his ribs. Chest x-ray and rib x-ray revealed no evidence of broken ribs. The patient was treated symptomatically and now has no pain. He is breathing better and has less chest congestion. He denies chest pain or palpitations.  Objective: Filed Vitals:   10/04/13 0900  BP: 132/84  Pulse: 103  Temp:   Resp: 21   temperature 97.9 pulse 103 respiratory rate 21 blood pressure 132/84  Intake/Output Summary (Last 24 hours) at 10/04/13 1059 Last data filed at 10/04/13 0800  Gross per 24 hour  Intake   1190 ml  Output   1225 ml  Net    -35 ml   Filed Weights   10/02/13 2035 10/02/13 2341 10/04/13 0500  Weight: 124.739 kg (275 lb) 129.4 kg (285 lb 4.4 oz) 129.4 kg (285 lb 4.4 oz)    Exam:   General:  Pleasant 53 year-old man sitting up in bed, in no acute distress.  Cardiovascular: Irregular, irregular, with borderline tachycardia  Respiratory: Clear to auscultation  Abdomen: Obese, positive bowel sounds, soft, nontender, nondistended.  Musculoskeletal: No acute hot red joints. Trace  bilateral lower extremity edema. Right lower lateral chest wall without erythema or tenderness.  Neurologic: He is alert and oriented x3. Cranial nerves II through XII are intact.   Data Reviewed: Basic Metabolic Panel:  Recent Labs Lab 10/02/13 2035 10/03/13 0422 10/04/13 0448  NA 143 144 142  K 3.7 3.3* 3.9  CL 105 106 105  CO2 28 27 26   GLUCOSE 188* 221* 238*  BUN 13 12 20   CREATININE 0.93 0.83 0.97   CALCIUM 9.0 9.1 8.8   Liver Function Tests:  Recent Labs Lab 10/02/13 2035 10/03/13 0422  AST 43* 41*  ALT 44 45  ALKPHOS 52 52  BILITOT 0.8 0.9  PROT 6.8 7.4  ALBUMIN 3.5 3.8   No results found for this basename: LIPASE, AMYLASE,  in the last 168 hours No results found for this basename: AMMONIA,  in the last 168 hours CBC:  Recent Labs Lab 10/02/13 2035 10/03/13 0422 10/04/13 0448  WBC 10.0 6.7 14.9*  NEUTROABS 6.7  --   --   HGB 13.1 13.5 12.6*  HCT 37.8* 40.1 38.0*  MCV 96.9 97.1 98.7  PLT 259 273 280   Cardiac Enzymes:  Recent Labs Lab 10/02/13 2035  TROPONINI <0.30   BNP (last 3 results)  Recent Labs  10/02/13 2035  PROBNP 436.4*   CBG:  Recent Labs Lab 10/03/13 0754 10/03/13 1126 10/03/13 1651 10/03/13 2121 10/04/13 0737  GLUCAP 202* 212* 183* 228* 225*    Recent Results (from the past 240 hour(s))  MRSA PCR SCREENING     Status: None   Collection Time    10/02/13 11:50 PM      Result Value Ref Range Status   MRSA by PCR NEGATIVE  NEGATIVE Final   Comment:            The GeneXpert MRSA Assay (FDA     approved for NASAL specimens     only), is one component of  a     comprehensive MRSA colonization     surveillance program. It is not     intended to diagnose MRSA     infection nor to guide or     monitor treatment for     MRSA infections.     Studies: Dg Chest 2 View  10/03/2013   CLINICAL DATA:  Chest pain following coughing  EXAM: CHEST  2 VIEW  COMPARISON:  10/02/2013  FINDINGS: Cardiac shadow is stable. Minimal left basilar atelectasis is noted. No acute bony abnormality is seen. No sizable effusion is noted.  IMPRESSION: Mild left basilar atelectasis.   Electronically Signed   By: Alcide Clever M.D.   On: 10/03/2013 19:54   Dg Ribs Unilateral Right  10/03/2013   CLINICAL DATA:  Right-sided rib pain following coughing  EXAM: RIGHT RIBS - 2 VIEW  COMPARISON:  None.  FINDINGS: No acute rib fracture is identified.  No pneumothorax  is seen.  IMPRESSION: No acute abnormality noted.   Electronically Signed   By: Alcide Clever M.D.   On: 10/03/2013 19:55   US Venous Img Lower Bilateral  10/03/2013   CLINICAL DATA:  Left calf swelling.  EXAM: Bilateral. LOWER EXTREMITY VENOUS DOPPLER ULTRASOUND  TECHNIQUE: Gray-scale sonography with graded compression, as well as color Doppler and duplex ultrasound, were performed to evaluate the deep venous system from the level of the common femoral vein through the popliteal and proximal calf veins. Spectral Doppler was utilized to evaluate flow at rest and with distal augmentation maneuvers.  COMPARISON:  None.  FINDINGS: Thrombus within deep veins:  None visualized.  Compressibility of deep veins:  Normal.  Duplex waveform respiratory phasicity:  Normal.  Duplex waveform response to augmentation:  Normal.  Venous reflux:  None visualized.  Other findings: Left calf edema limited evaluation of the left calf veins.  IMPRESSION: There is no evidence of deep venous thrombosis within the right or left lower extremities. Evaluation of the left calf veins was somewhat limited due to left-sided calf edema.   Electronically Signed   By: David  [place]   On: 10/03/2013 10:03   Dg Chest Port 1 View  10/02/2013   CLINICAL DATA:  Shortness of breath since Monday.  EXAM: PORTABLE CHEST - 1 VIEW  COMPARISON:  DG CHEST 1V PORT dated 04/27/2012  FINDINGS: The cardiac silhouette appears upper limits of normal in size, mediastinal silhouette is unremarkable. No pleural effusions or consolidations, though right costophrenic angle is incompletely imaged. No pneumothorax. Soft tissue planes and included osseous structures are nonsuspicious.  IMPRESSION: Borderline cardiomegaly, no acute pulmonary process.   Electronically Signed   By: Awilda Metro   On: 10/02/2013 20:59    Scheduled Meds: . aspirin EC  81 mg Oral Daily  . atorvastatin  20 mg Oral q1800  . diltiazem  60 mg Oral 4 times per day  . enoxaparin (LOVENOX)  injection  40 mg Subcutaneous Q24H  . famotidine  20 mg Oral Daily  . insulin aspart  0-20 Units Subcutaneous TID WC  . insulin aspart  0-5 Units Subcutaneous QHS  . insulin detemir  10 Units Subcutaneous QHS  . levalbuterol  0.63 mg Nebulization TID AC  . levofloxacin  500 mg Oral Daily  . metFORMIN  500 mg Oral BID WC  . potassium chloride  20 mEq Oral BID  . predniSONE  50 mg Oral BID WC  . sodium chloride  3 mL Intravenous Q12H   Continuous Infusions:   Assessment:  Principal Problem:   Acute bronchitis Active Problems:   Atrial fibrillation with RVR   Pedal edema   Obesity   HTN (hypertension), benign   Hypercholesteremia   Hyperglycemia, drug-induced   DM type 2 (diabetes mellitus, type 2)   1. Acute bronchitis. Clinically improving. We will discontinue Solu-Medrol and start oral prednisone and taper it accordingly. We'll discontinue Atrovent nebulizer and continue Xopenex at 3 times a day dosing. Hopefully this will help with his heart rate control. We'll continue Levaquin. Continue supportive treatment.  Severe right lower lateral chest pain. Now resolved. Chest x-ray revealed no evidence of pneumothorax in the rib film revealed no evidence of fractures. We'll continue to treat symptomatically.  Chronic atrial fibrillation with rapid ventricular response. Rapid response likely exacerbated by acute bronchitis. TSH within normal limits. Bilateral lower extremity venous ultrasound negative for DVT. His heart rate is ranging from 87-103. We'll continue every 6 hour dosing of diltiazem. The diltiazem drip has been discontinued. Full dose Lovenox was started empirically, but was discontinued secondary to negative DVT. Continue aspirin.  Right greater than left lower extremity edema. Venous ultrasound negative for DVT. 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  Hypertension. Currently stable. Lisinopril/HCTZ is on hold.  We'll continue oral diltiazem. Given his new diagnosis of diabetes, will favor restarting lisinopril at a lower dose.  Newly diagnosed type 2 diabetes with steroid-induced hyperglycemia. The patient's hemoglobin A1c is 7.1, consistent with diabetes mellitus. The patient was informed of this. We'll continue sliding scale NovoLog and Levemir. We'll add metformin. It is likely he will be discharged on metformin and Levemir or Lantus. We'll order diabetes education. steroid induced.  Hyperlipidemia. Continue statin therapy.  Mild hypokalemia. Resolved following potassium chloride supplementation.  Leukocytosis, steroid induced.     Plan: 1. Plan as stated above in the assessment. 2. Transfer to telemetry.  Time spent: 35 minutes.    [facility] SystemFISHER,Asad Keeven  [facility] Pager [phone]. If 7PM-7AM, please contact night-coverage at [email or web address], password [email or web address] 10/04/2013, 10:59 AM  LOS: 2 days

## 2013-10-04 NOTE — Progress Notes (Signed)
Pt watched DM videos on the education channel.

## 2013-10-05 LAB — GLUCOSE, CAPILLARY
GLUCOSE-CAPILLARY: 177 mg/dL — AB (ref 70–99)
Glucose-Capillary: 192 mg/dL — ABNORMAL HIGH (ref 70–99)

## 2013-10-05 MED ORDER — ALBUTEROL SULFATE HFA 108 (90 BASE) MCG/ACT IN AERS: 2.0000 | INHALATION_SPRAY | Freq: Four times a day (QID) | RESPIRATORY_TRACT | Status: AC

## 2013-10-05 MED ORDER — ATORVASTATIN CALCIUM 20 MG PO TABS: 20.0000 mg | ORAL_TABLET | Freq: Every day | ORAL | Status: AC

## 2013-10-05 MED ORDER — BENZONATATE 100 MG PO CAPS
100.0000 mg | ORAL_CAPSULE | Freq: Three times a day (TID) | ORAL | Status: AC | PRN
Start: 2013-10-05 — End: ?

## 2013-10-05 MED ORDER — METFORMIN HCL 500 MG PO TABS: 500.0000 mg | ORAL_TABLET | Freq: Two times a day (BID) | ORAL | Status: AC

## 2013-10-05 MED ORDER — LISINOPRIL 10 MG PO TABS
10.0000 mg | ORAL_TABLET | Freq: Every day | ORAL | Status: AC
Start: 2013-10-05 — End: ?

## 2013-10-05 MED ORDER — PREDNISONE 10 MG PO TABS: ORAL_TABLET | ORAL | Status: AC

## 2013-10-05 MED ORDER — DILTIAZEM HCL ER COATED BEADS 180 MG PO CP24: 180.0000 mg | ORAL_CAPSULE | Freq: Every day | ORAL | Status: AC

## 2013-10-05 MED ORDER — OXYCODONE-ACETAMINOPHEN 5-325 MG PO TABS: 1.0000 | ORAL_TABLET | Freq: Four times a day (QID) | ORAL | Status: AC | PRN

## 2013-10-05 MED ORDER — LEVOFLOXACIN 500 MG PO TABS: 500.0000 mg | ORAL_TABLET | Freq: Every day | ORAL | Status: AC

## 2013-10-05 NOTE — Discharge Planning (Signed)
Pt ambulated about 54000ft on RA - at rest HR was 88, climbed to 125, and return to 94  After about 2 minutes at rest again.  Pt showed no s/sx of distress or dyspnea while ambulating.  SPO2 levels remained above 90 at all times.

## 2013-10-05 NOTE — Discharge Summary (Signed)
Physician Discharge Summary  [name] RumbleDennis Birchmeier ZOX:[id number] DOB: June 18, 1961 DOA: 10/02/2013  PCP: Delorse LekBlair, Diane W, FNP  Admit date: 10/02/2013 Discharge date: 10/05/2013  Time spent: Greater than 30 minutes  Recommendations for Outpatient Follow-up:  1.   Discharge Diagnoses:  1. Acute bronchitis. 2. Chronic atrial fibrillation, with presenting RVR. 3. Newly diagnosed diabetes mellitus with a hemoglobin A1c of 7.1. 4. Hypertension. 5. Pedal edema. Venous ultrasound of the lower extremities negative for DVT. 6. Hyperlipidemia. 7. Obesity.  Discharge Condition: Improved.  Diet recommendation: Heart healthy/carbohydrate modified.  Filed Weights   10/02/13 2035 10/02/13 2341 10/04/13 0500  Weight: 124.739 kg (275 lb) 129.4 kg (285 lb 4.4 oz) 129.4 kg (285 lb 4.4 oz)    History of present illness:  [name] Love is a 53 y.o. male with a past medical history of chronic atrial fibrillation just on aspirin, hypertension, hypercholesterolemia who was in his usual state of health when he started having wheezing. This progressively got worse and he felt acutely short of breath so, he decided to come in to the hospital. He denied any chest pain. No nausea or vomiting, or fever. Has had some chills. Had a productive cough with yellowish expectoration. Denied any blood in the sputum. Because of the cough and, wheezing he was not been able to sleep well. He also noticed swelling in his right ankle and right leg more than the left over the past couple days. Denied any dizziness. He had never had these symptoms before. He stopped smoking tobacco about 24 years ago. No passive exposure. 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   Hospital Course:   1. Acute bronchitis.  The patient's chest x-ray on admission revealed no acute infiltrate or edema. For treatment, he was started on Xopenex/Atrovent nebulizers, IV Solu-Medrol, and Levaquin. Supportive treatment was given with as needed antitussive  medications. Oxygen was applied and titrated to keep his oxygen saturations greater than 92% or to comfort. Over the course of the hospitalization, his symptoms improved significantly. At the time of hospital discharge, he had only rare bronchospasms. He was oxygenating in the upper 90s on room air. His cough had subsided. He was discharged on albuterol inhaler, 2 puffs 4 times daily for several more days, and then as needed thereafter; prednisone taper, and a few more days of Levaquin. His white blood cell count did increase, but this is likely secondary to steroid therapy. His white blood cell count was within normal limits on admission. He remained afebrile throughout the hospitalization. 2. Chronic atrial fibrillation with rapid ventricular response. The patient had not been treated with any rate limiting medications prior to this hospitalization. However, his heart rate did increase to the lower 100s to the 130s. This was thought to be exacerbated by his acute bronchitis. Therefore, diltiazem drip was started. Lisinopril/HCTZ was withheld. The diltiazem drip was titrated accordingly. He remained on the drip for approximately 24 hours. Eventually, oral diltiazem was started and the drip was discontinued. At the time of discharge, his resting heart rate was in the 70s and his ambulatory heart rate was in the 120s. He had no complaints of chest pain during hospitalization. His cardiac enzymes were within normal limits. His TSH was within normal limits. He was maintained on aspirin. He was discharged on oral diltiazem and aspirin. 3. Newly diagnosed type 2 diabetes mellitus. The patient was noted to have hyperglycemia on admission with a venous glucose of 188. He had no prior known history of diabetes mellitus. The hyperglycemia was  thought to be in part due to to steroids he received in the ED. He was started on sliding scale NovoLog and Lantus. Hemoglobin A1c was 7.1, indicating diabetes mellitus. He was  informed of this. He was educated on diabetes mellitus, diet, and home glucose monitoring. Metformin was eventually started. Lantus and sliding-scale NovoLog was titrated off as his steroids were weaned down. He was discharged on metformin twice daily. Further management will be deferred to his primary care physician. Of note, the patient stated that he was going to improve his diet and improve his physical activity. 4. Bilateral lower extremity edema. The patient is a truck driver, and therefore, there was concern about DVT. He was started on full dose Lovenox empirically. Lower extremity venous Doppler study was negative for DVT. 5. Hypertension. The patient was treated chronically with lisinopril/HCTZ. HCTZ was discontinued. Lisinopril was held until discharge. He was discharged on a lower dose of lisinopril with the addition of diltiazem. His blood pressure was well-controlled during the hospitalization. 6. Hyperlipidemia. The patient was maintained on statin therapy. Per the recommendation by pharmacy, Zocor was discontinued in favor of Lipitor. Apparently, there is an increased risk of rhabdomyolysis with Zocor and diltiazem combination. The patient was informed of this.    Procedures:  None  Consultations:  None  Discharge Exam: Filed Vitals:   10/05/13 1209  BP: 121/64  Pulse:   Temp:   Resp:     General: Pleasant 53 year old man in no acute distress. Cardiovascular: Irregular, irregular. Respiratory: Occasional wheezes, breathing nonlabored. Air movement down to the bases.  Discharge Instructions  Discharge Orders   Future Orders Complete By Expires   Diet - low sodium heart healthy  As directed    Diet Carb Modified  As directed    Discharge instructions  As directed    Comments:     Check your blood sugars at least once or twice daily.   Increase activity slowly  As directed        Medication List    STOP taking these medications       ibuprofen 800 MG  tablet  Commonly known as:  ADVIL,MOTRIN     lisinopril-hydrochlorothiazide 20-25 MG per tablet  Commonly known as:  PRINZIDE,ZESTORETIC     simvastatin 40 MG tablet  Commonly known as:  ZOCOR      TAKE these medications       albuterol 108 (90 BASE) MCG/ACT inhaler  Commonly known as:  PROVENTIL HFA;VENTOLIN HFA  Inhale 2 puffs into the lungs every 6 (six) hours.     aspirin EC 81 MG tablet  Take 81 mg by mouth daily.     atorvastatin 20 MG tablet  Commonly known as:  LIPITOR  Take 1 tablet (20 mg total) by mouth daily at 6 PM. New medication to treat her high cholesterol.     benzonatate 100 MG capsule  Commonly known as:  TESSALON  Take 1 capsule (100 mg total) by mouth 3 (three) times daily as needed for cough.     diltiazem 180 MG 24 hr capsule  Commonly known as:  CARDIZEM CD  Take 1 capsule (180 mg total) by mouth daily. For your heart rate and blood pressure.     levofloxacin 500 MG tablet  Commonly known as:  LEVAQUIN  Take 1 tablet (500 mg total) by mouth daily. Antibiotic to be taken for 5 more days     lisinopril 10 MG tablet  Commonly known as:  PRINIVIL,ZESTRIL  Take  1 tablet (10 mg total) by mouth daily.     metFORMIN 500 MG tablet  Commonly known as:  GLUCOPHAGE  Take 1 tablet (500 mg total) by mouth 2 (two) times daily with a meal.     multivitamin tablet  Take 1 tablet by mouth daily.     oxyCODONE-acetaminophen 5-325 MG per tablet  Commonly known as:  ROXICET  Take 1 tablet by mouth every 6 (six) hours as needed for severe pain.     predniSONE 10 MG tablet  Commonly known as:  DELTASONE  Starting tomorrow, take 6 tablets daily for 2 days; then 5 tablets the next day; then 4 tablets the next day; then 3 tablets the next day; then 2 tablets the next day; then 1 tablet the next day; then stop.     RED YEAST RICE PO  Take 1 capsule by mouth 2 (two) times daily.     vitamin C 1000 MG tablet  Take 1,000 mg by mouth 2 (two) times daily.        No Known Allergies     Follow-up Information   Please follow up. (Followup with Dr. Carlena Sax in 3-7 days.)        The results of significant diagnostics from this hospitalization (including imaging, microbiology, ancillary and laboratory) are listed below for reference.    Significant Diagnostic Studies: Dg Chest 2 View  10/03/2013   CLINICAL DATA:  Chest pain following coughing  EXAM: CHEST  2 VIEW  COMPARISON:  10/02/2013  FINDINGS: Cardiac shadow is stable. Minimal left basilar atelectasis is noted. No acute bony abnormality is seen. No sizable effusion is noted.  IMPRESSION: Mild left basilar atelectasis.   Electronically Signed   By: Alcide Clever M.D.   On: 10/03/2013 19:54   Dg Ribs Unilateral Right  10/03/2013   CLINICAL DATA:  Right-sided rib pain following coughing  EXAM: RIGHT RIBS - 2 VIEW  COMPARISON:  None.  FINDINGS: No acute rib fracture is identified.  No pneumothorax is seen.  IMPRESSION: No acute abnormality noted.   Electronically Signed   By: Alcide Clever M.D.   On: 10/03/2013 19:55   US Venous Img Lower Bilateral  10/03/2013   CLINICAL DATA:  Left calf swelling.  EXAM: Bilateral. LOWER EXTREMITY VENOUS DOPPLER ULTRASOUND  TECHNIQUE: Gray-scale sonography with graded compression, as well as color Doppler and duplex ultrasound, were performed to evaluate the deep venous system from the level of the common femoral vein through the popliteal and proximal calf veins. Spectral Doppler was utilized to evaluate flow at rest and with distal augmentation maneuvers.  COMPARISON:  None.  FINDINGS: Thrombus within deep veins:  None visualized.  Compressibility of deep veins:  Normal.  Duplex waveform respiratory phasicity:  Normal.  Duplex waveform response to augmentation:  Normal.  Venous reflux:  None visualized.  Other findings: Left calf edema limited evaluation of the left calf veins.  IMPRESSION: There is no evidence of deep venous thrombosis within the right or left lower  extremities. Evaluation of the left calf veins was somewhat limited due to left-sided calf edema.   Electronically Signed   By: David  [place]   On: 10/03/2013 10:03   Dg Chest Port 1 View  10/02/2013   CLINICAL DATA:  Shortness of breath since Monday.  EXAM: PORTABLE CHEST - 1 VIEW  COMPARISON:  DG CHEST 1V PORT dated 04/27/2012  FINDINGS: The cardiac silhouette appears upper limits of normal in size, mediastinal silhouette is unremarkable. No pleural effusions  or consolidations, though right costophrenic angle is incompletely imaged. No pneumothorax. Soft tissue planes and included osseous structures are nonsuspicious.  IMPRESSION: Borderline cardiomegaly, no acute pulmonary process.   Electronically Signed   By: Awilda Metro   On: 10/02/2013 20:59    Microbiology: Recent Results (from the past 240 hour(s))  MRSA PCR SCREENING     Status: None   Collection Time    10/02/13 11:50 PM      Result Value Ref Range Status   MRSA by PCR NEGATIVE  NEGATIVE Final   Comment:            The GeneXpert MRSA Assay (FDA     approved for NASAL specimens     only), is one component of a     comprehensive MRSA colonization     surveillance program. It is not     intended to diagnose MRSA     infection nor to guide or     monitor treatment for     MRSA infections.     Labs: Basic Metabolic Panel:  Recent Labs Lab 10/02/13 2035 10/03/13 0422 10/04/13 0448  NA 143 144 142  K 3.7 3.3* 3.9  CL 105 106 105  CO2 28 27 26   GLUCOSE 188* 221* 238*  BUN 13 12 20   CREATININE 0.93 0.83 0.97  CALCIUM 9.0 9.1 8.8   Liver Function Tests:  Recent Labs Lab 10/02/13 2035 10/03/13 0422  AST 43* 41*  ALT 44 45  ALKPHOS 52 52  BILITOT 0.8 0.9  PROT 6.8 7.4  ALBUMIN 3.5 3.8   No results found for this basename: LIPASE, AMYLASE,  in the last 168 hours No results found for this basename: AMMONIA,  in the last 168 hours CBC:  Recent Labs Lab 10/02/13 2035 10/03/13 0422 10/04/13 0448  WBC 10.0  6.7 14.9*  NEUTROABS 6.7  --   --   HGB 13.1 13.5 12.6*  HCT 37.8* 40.1 38.0*  MCV 96.9 97.1 98.7  PLT 259 273 280   Cardiac Enzymes:  Recent Labs Lab 10/02/13 2035  TROPONINI <0.30   BNP: BNP (last 3 results)  Recent Labs  10/02/13 2035  PROBNP 436.4*   CBG:  Recent Labs Lab 10/04/13 1123 10/04/13 1616 10/04/13 2048 10/05/13 0745 10/05/13 1122  GLUCAP 231* 259* 293* 177* 192*       Signed:  Tomie Elko  [facility] 10/05/2013, 12:42 PM

## 2013-10-05 NOTE — Discharge Planning (Signed)
Pt stated he was ready to go home and pain was under control.  His IV and tele were removed.  Pt was given wk note and scripts and told of suggested FU appointments.  Pt was given DC papers and educated on S/sx of further infection or resp difficulties.  Pt told when to call dr. Or return to the hospital.  Pt was walked to car by RN and family.

## 2015-03-02 IMAGING — US US EXTREM LOW VENOUS BILAT
1 series · 14 of 24 positions shown · non-contrast
Comparison: None.

CLINICAL DATA: Left calf swelling.

EXAM:
Bilateral. LOWER EXTREMITY VENOUS DOPPLER ULTRASOUND
TECHNIQUE: Gray-scale sonography with graded compression, as well as color
Doppler and duplex ultrasound, were performed to evaluate the deep
venous system from the level of the common femoral vein through the
popliteal and proximal calf veins. Spectral Doppler was utilized to
evaluate flow at rest and with distal augmentation maneuvers.

[Series 1: us extrem low venous bilat · 0.10mm/px · 14 of 64 slices shown]
[im 1/64]
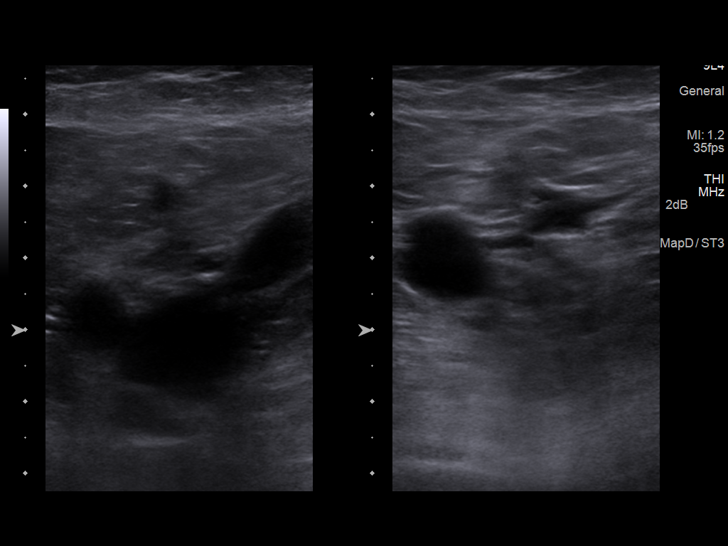
[im 6/64]
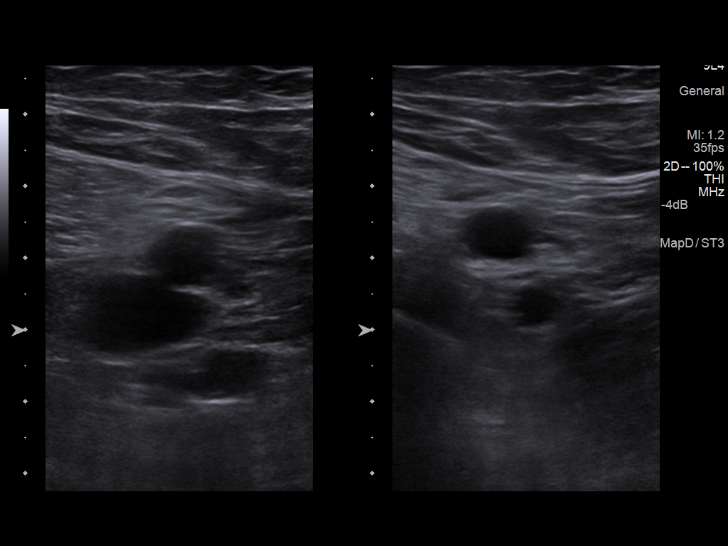
[im 11/64]
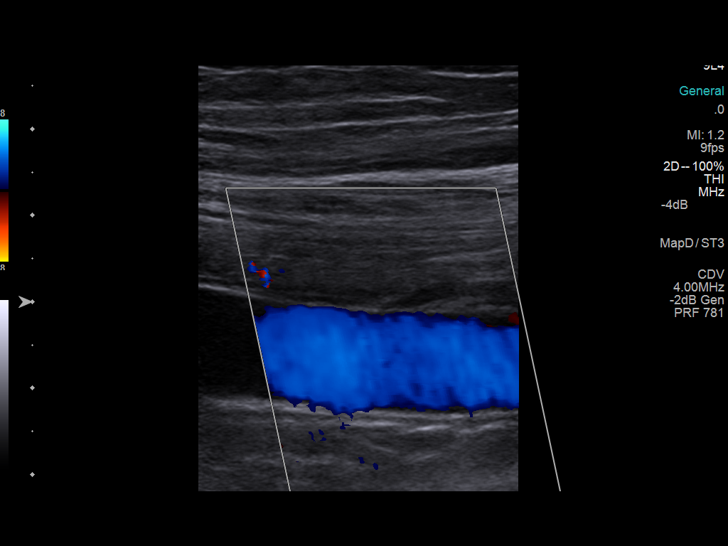
[im 17/64]
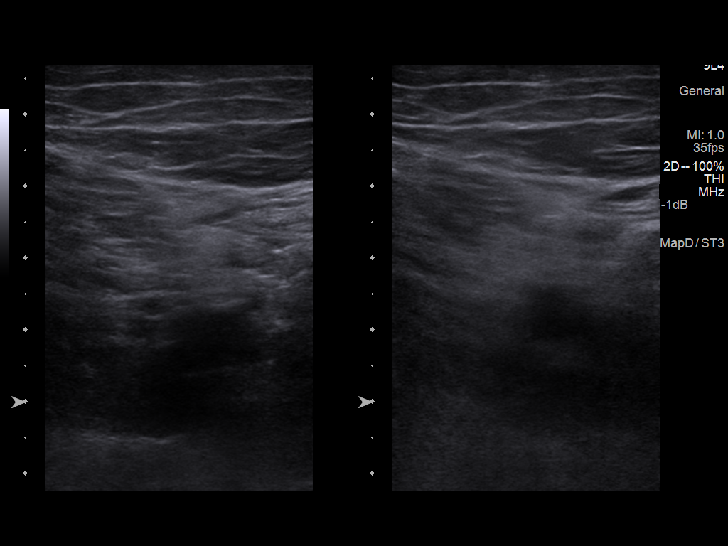
[im 20/64]
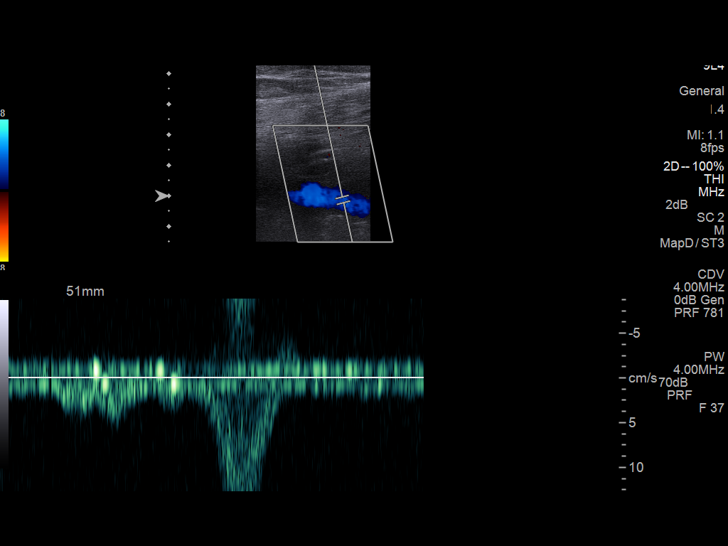
[im 25/64]
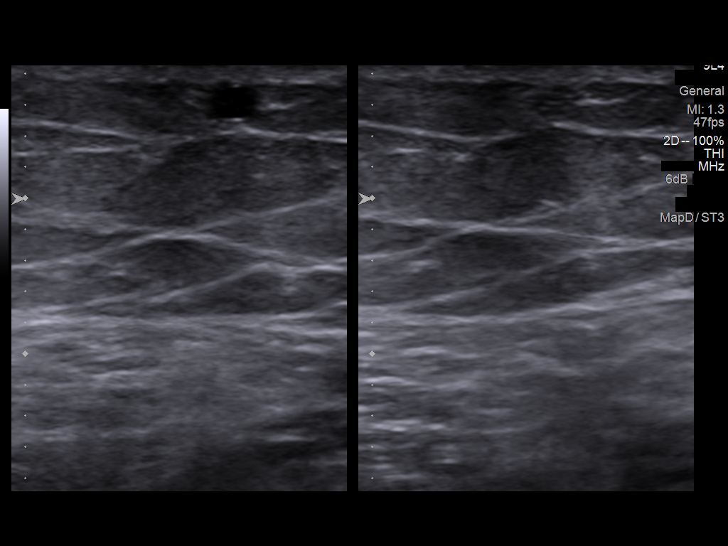
[im 31/64]
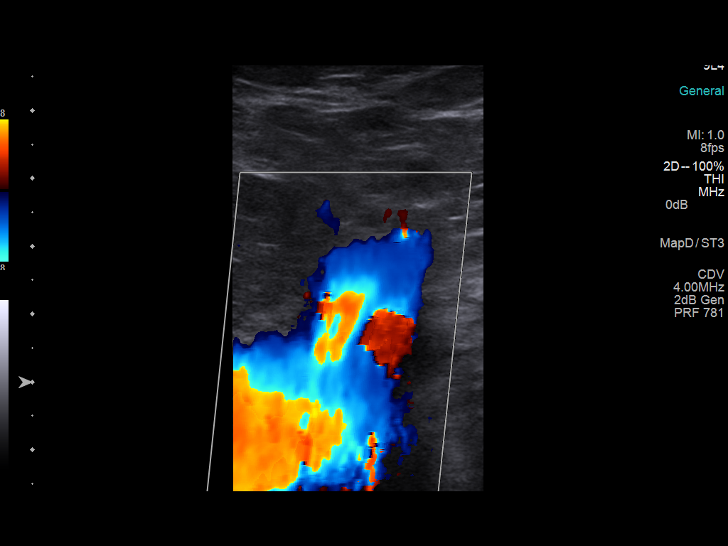
[im 33/64]
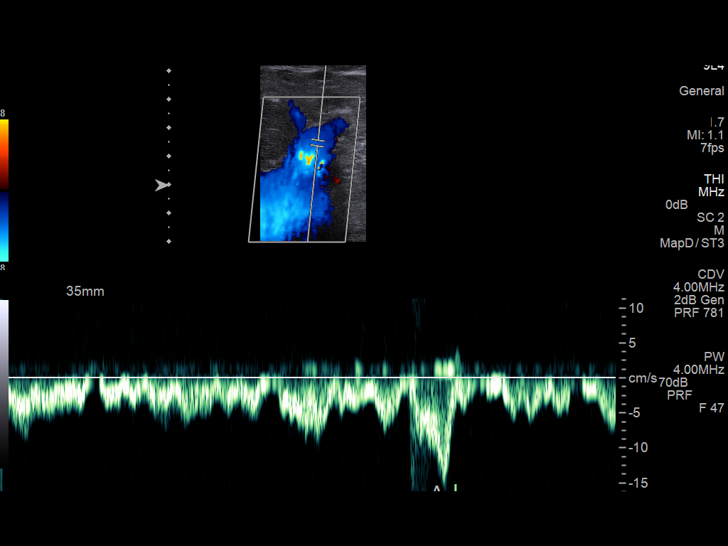
[im 39/64]
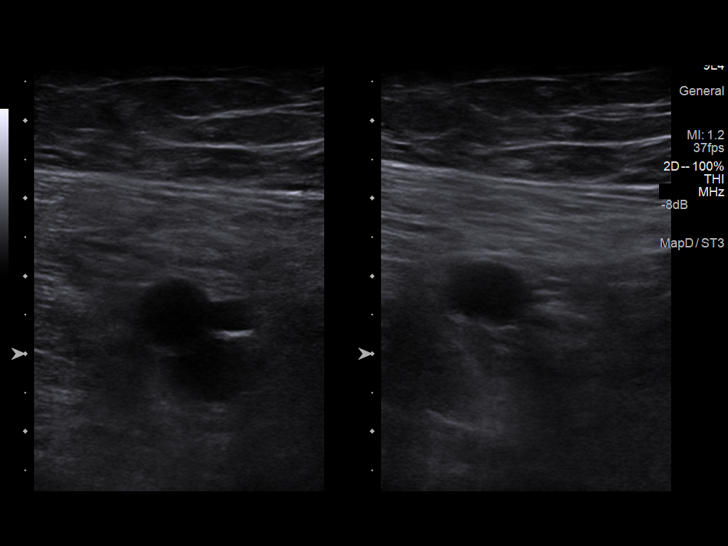
[im 44/64]
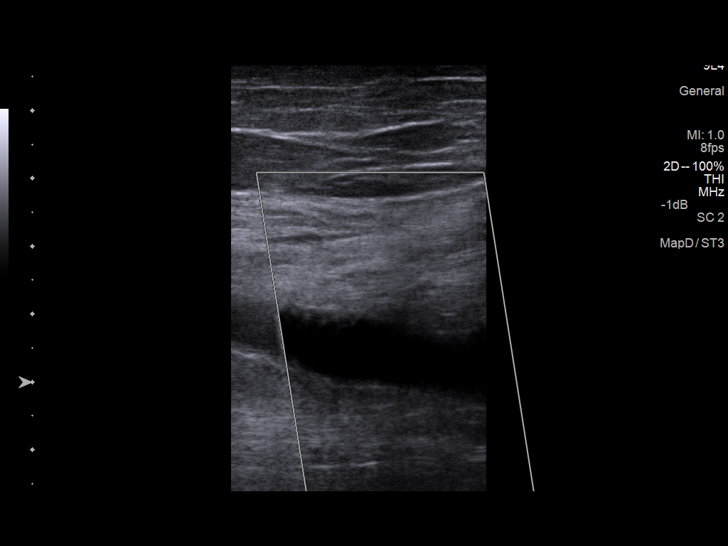
[im 50/64]
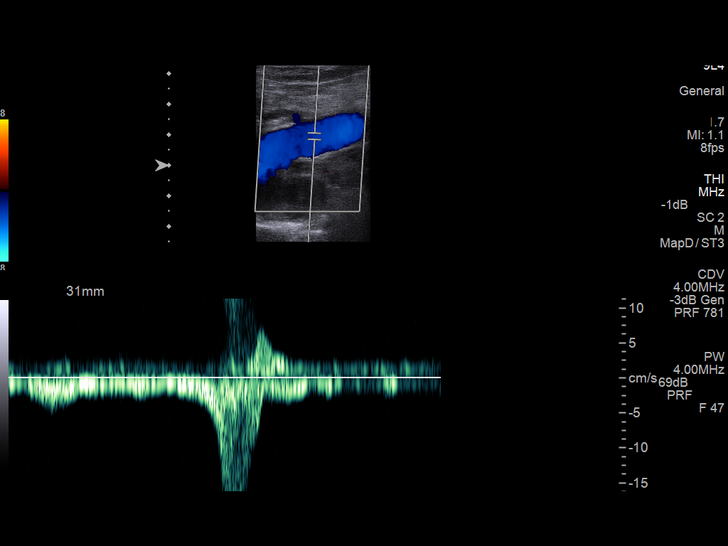
[im 53/64]
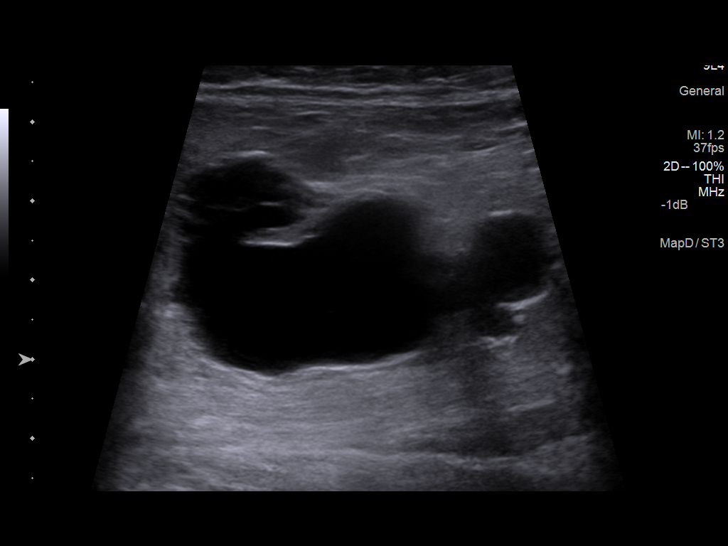
[im 58/64]
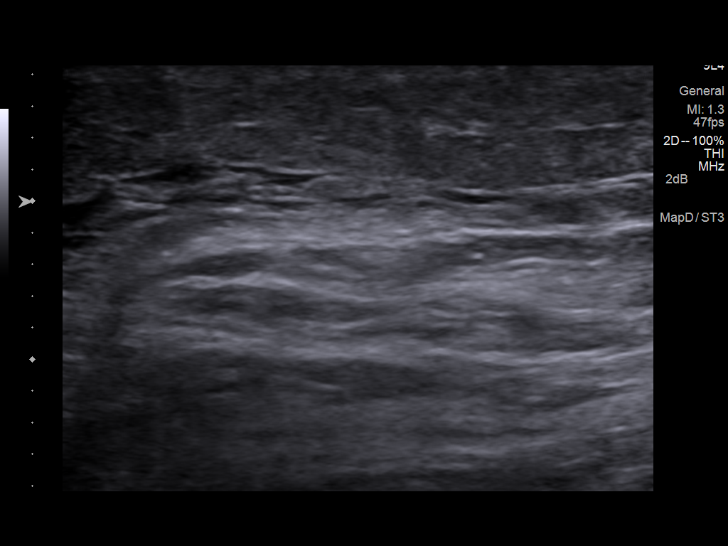
[im 64/64]
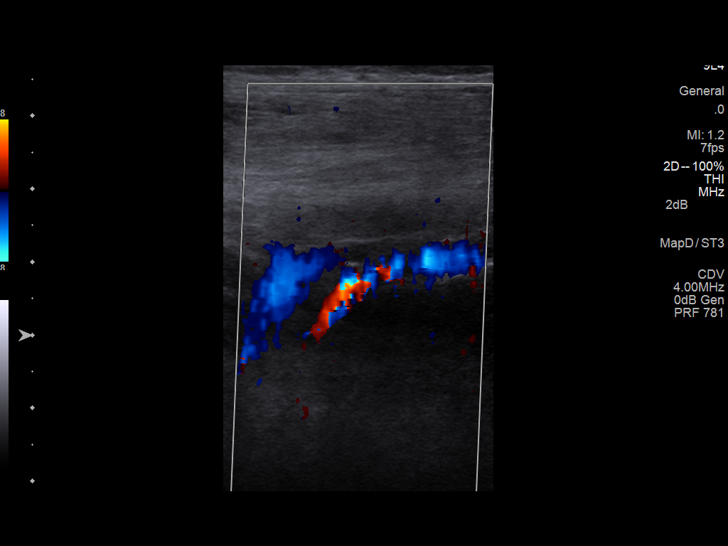

[14 of 24 positions shown; findings below may reference images not displayed]

FINDINGS: Thrombus within deep veins:  None visualized.

Compressibility of deep veins:  Normal.

Duplex waveform respiratory phasicity:  Normal.

Duplex waveform response to augmentation:  Normal.

Venous reflux:  None visualized.

Other findings: Left calf edema limited evaluation of the left calf
veins.
IMPRESSION: There is no evidence of deep venous thrombosis within the right or
left lower extremities. Evaluation of the left calf veins was
somewhat limited due to left-sided calf edema.

## 2015-03-02 IMAGING — CR DG RIBS 2V*R*
3 series · 3 of 3 positions shown · non-contrast
Comparison: None.

CLINICAL DATA: Right-sided rib pain following coughing

EXAM:
RIGHT RIBS - 2 VIEW

[view not recorded (1 of 3)]
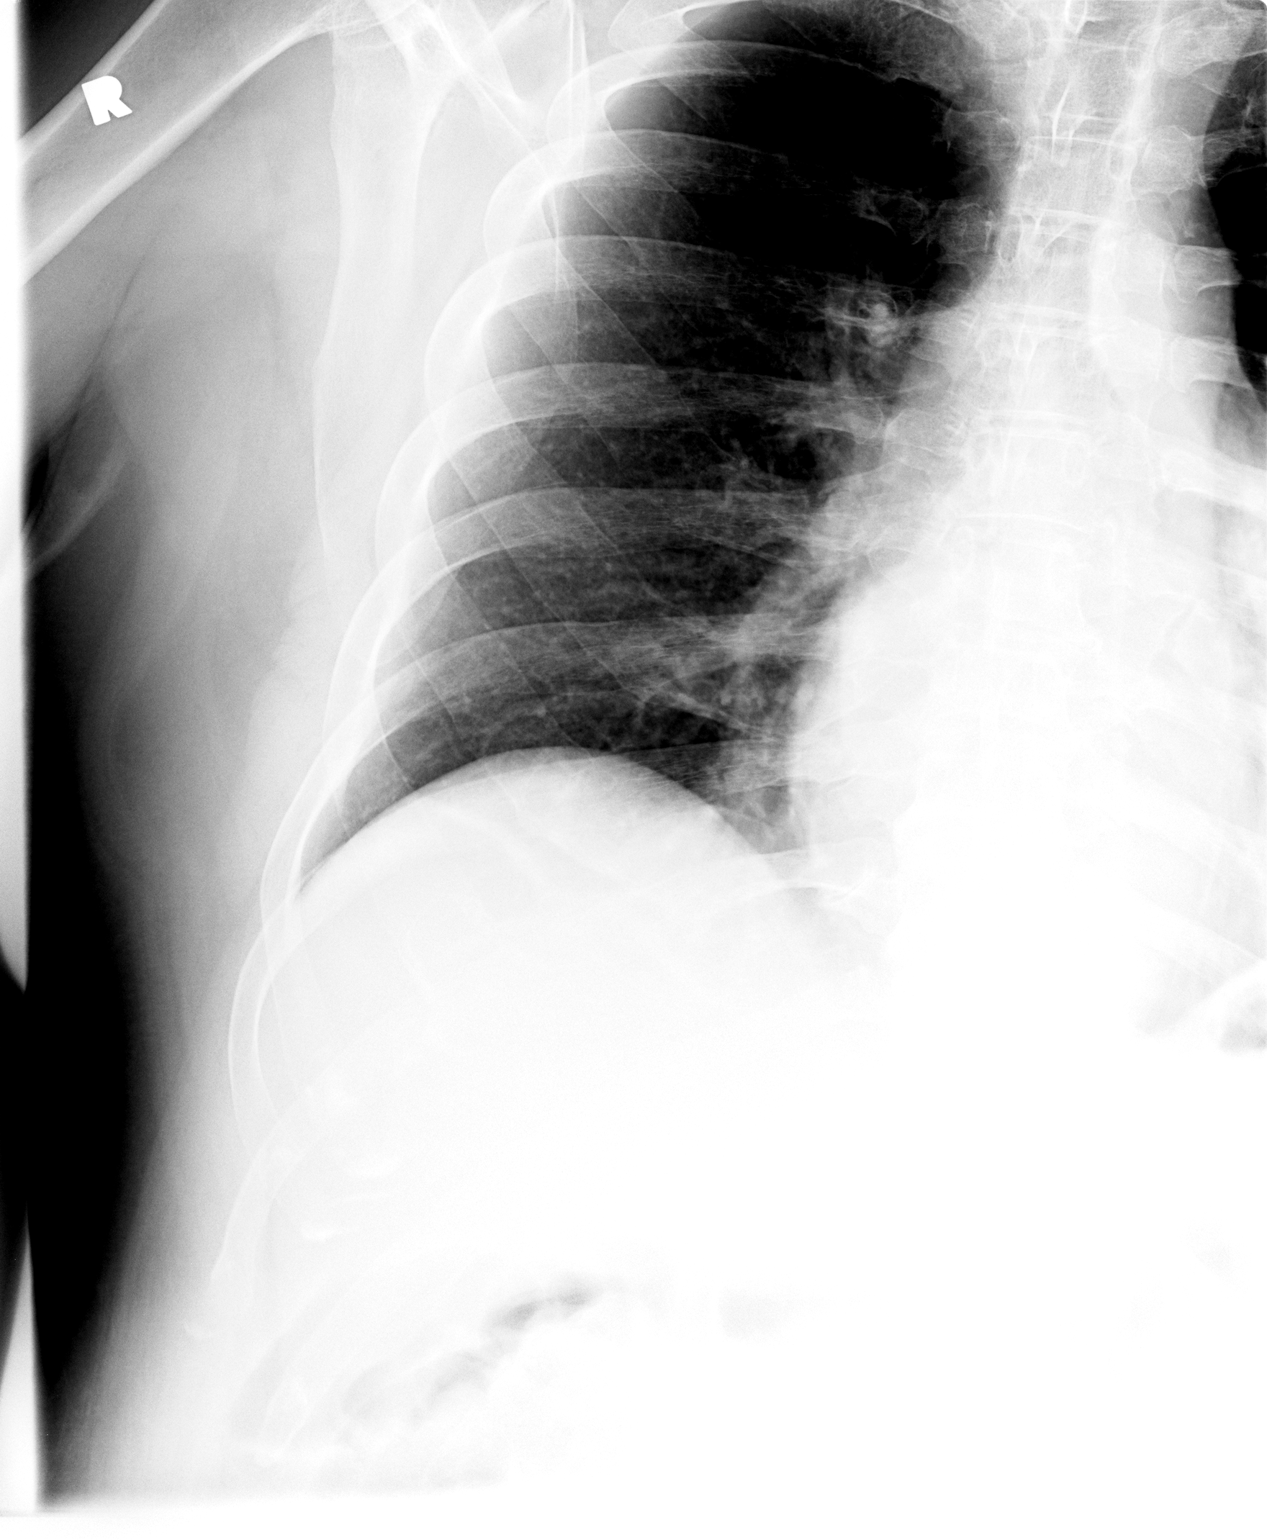

[view not recorded (2 of 3)]
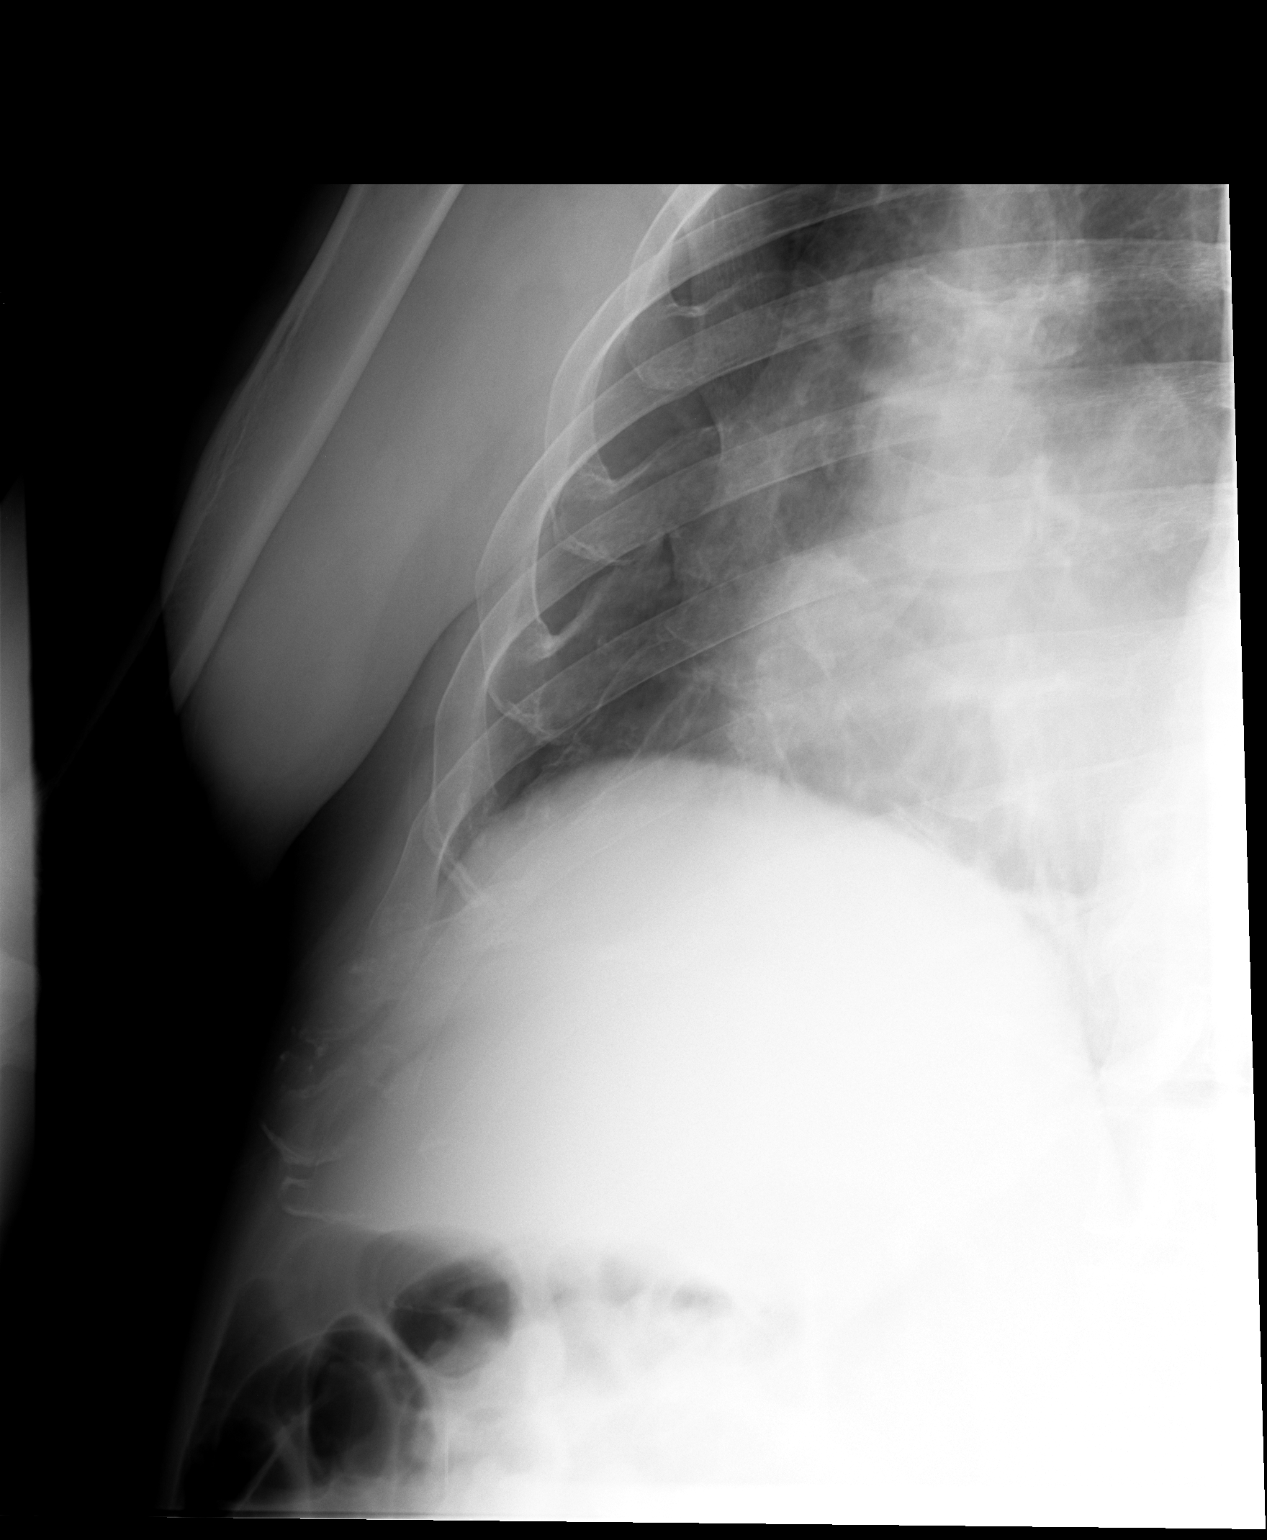

[view not recorded (3 of 3)]
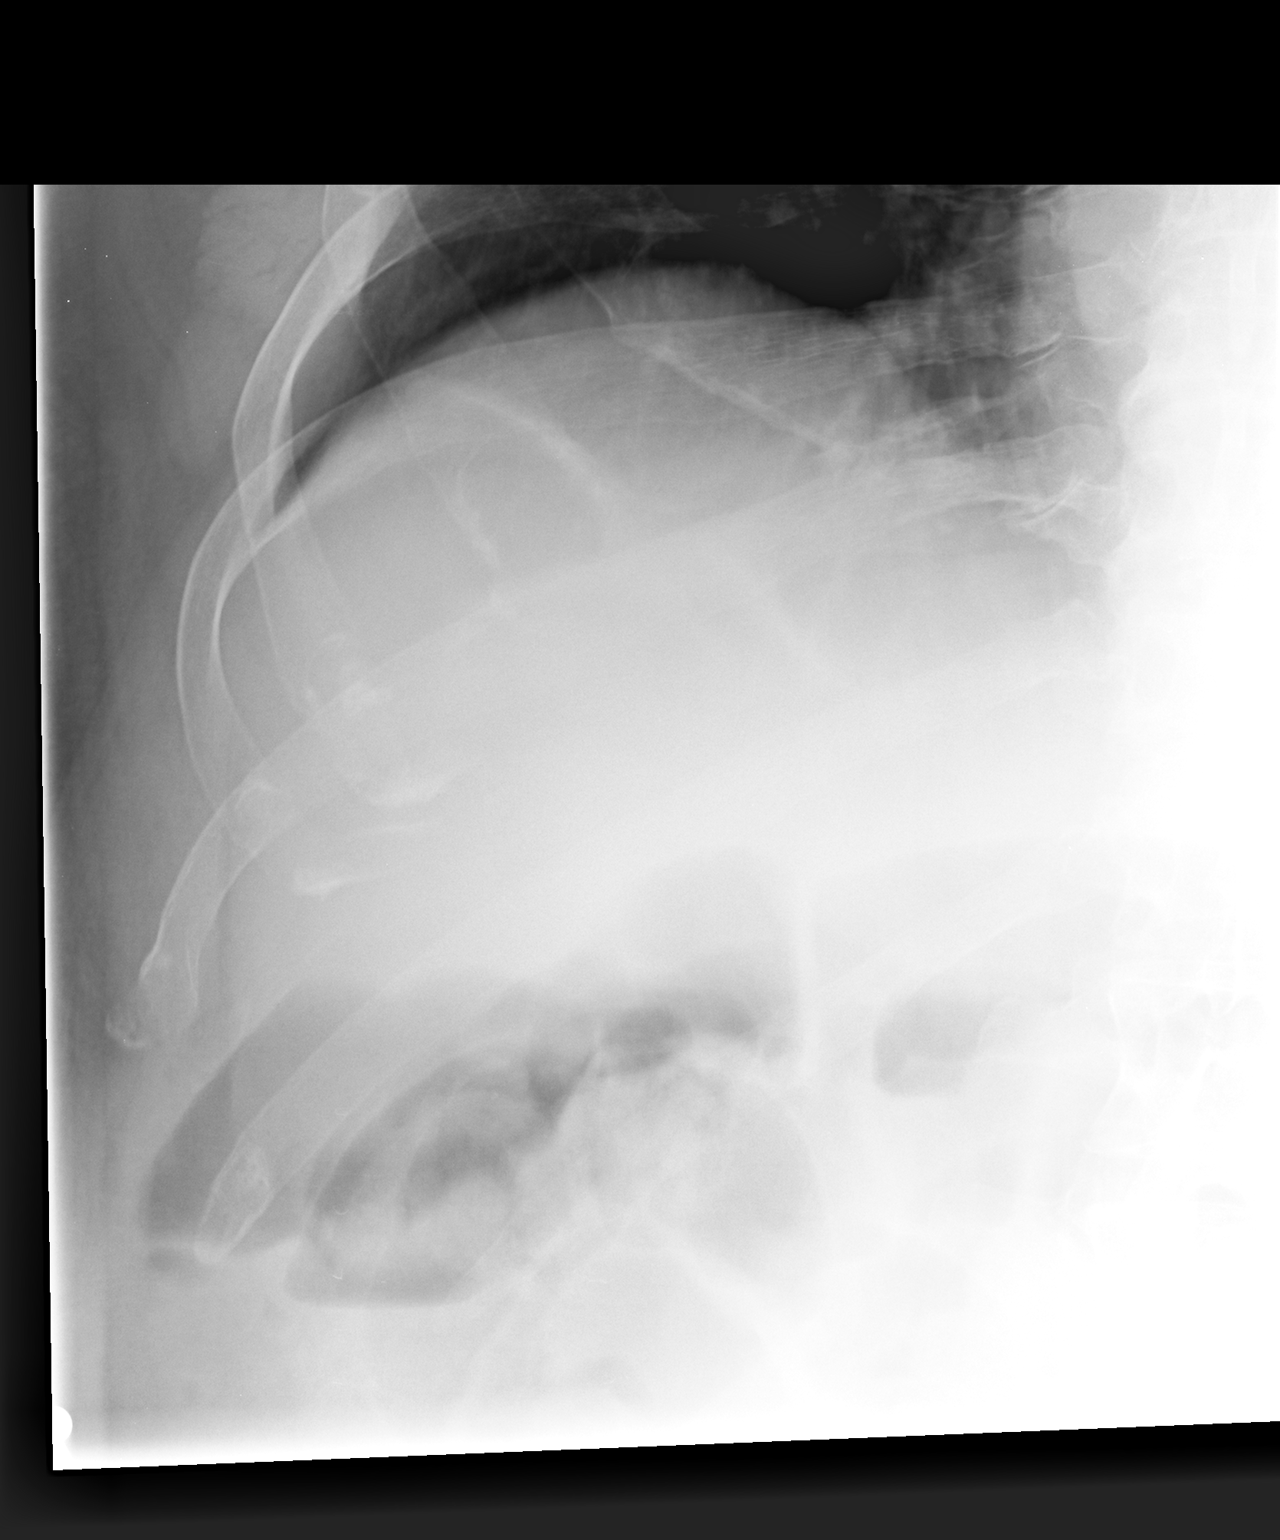

[3 of 3 positions shown; findings below may reference images not displayed]

FINDINGS: No acute rib fracture is identified.  No pneumothorax is seen.
IMPRESSION: No acute abnormality noted.
